# Patient Record
Sex: Female | Born: 1960 | Race: Black or African American | Hispanic: No | Marital: Married | Smoking: Never smoker
Health system: Southern US, Community
[De-identification: ages and names within clinical notes are randomized; demographics above are authoritative.]

## PROBLEM LIST (undated history)

## (undated) DIAGNOSIS — G4730 Sleep apnea, unspecified: Secondary | ICD-10-CM

## (undated) DIAGNOSIS — A6000 Herpesviral infection of urogenital system, unspecified: Secondary | ICD-10-CM

## (undated) DIAGNOSIS — D259 Leiomyoma of uterus, unspecified: Secondary | ICD-10-CM

## (undated) DIAGNOSIS — F419 Anxiety disorder, unspecified: Secondary | ICD-10-CM

## (undated) DIAGNOSIS — R195 Other fecal abnormalities: Secondary | ICD-10-CM

## (undated) HISTORY — PX: COLONOSCOPY: SHX174

## (undated) HISTORY — PX: BILATERAL OOPHORECTOMY: SHX1221

## (undated) HISTORY — PX: BREAST REDUCTION SURGERY: SHX8

## (undated) HISTORY — DX: Leiomyoma of uterus, unspecified: D25.9

## (undated) HISTORY — DX: Sleep apnea, unspecified: G47.30

## (undated) HISTORY — PX: APPENDECTOMY: SHX54

## (undated) HISTORY — DX: Anxiety disorder, unspecified: F41.9

## (undated) HISTORY — DX: Herpesviral infection of urogenital system, unspecified: A60.00

## (undated) HISTORY — DX: Other fecal abnormalities: R19.5

## (undated) HISTORY — PX: OTHER SURGICAL HISTORY: SHX169

## (undated) HISTORY — PX: ABDOMINAL HYSTERECTOMY: SHX81

---

## 1997-03-16 ENCOUNTER — Ambulatory Visit (HOSPITAL_COMMUNITY): Admission: RE | Admit: 1997-03-16 | Discharge: 1997-03-16 | Payer: Self-pay | Admitting: Obstetrics & Gynecology

## 1997-03-20 ENCOUNTER — Ambulatory Visit (HOSPITAL_COMMUNITY): Admission: RE | Admit: 1997-03-20 | Discharge: 1997-03-20 | Payer: Self-pay | Admitting: Obstetrics & Gynecology

## 1999-05-09 ENCOUNTER — Other Ambulatory Visit: Admission: RE | Admit: 1999-05-09 | Discharge: 1999-05-09 | Payer: Self-pay | Admitting: Obstetrics and Gynecology

## 1999-06-05 ENCOUNTER — Ambulatory Visit (HOSPITAL_COMMUNITY): Admission: RE | Admit: 1999-06-05 | Discharge: 1999-06-05 | Payer: Self-pay | Admitting: Obstetrics & Gynecology

## 1999-06-05 ENCOUNTER — Encounter: Payer: Self-pay | Admitting: Obstetrics & Gynecology

## 1999-06-19 ENCOUNTER — Ambulatory Visit (HOSPITAL_COMMUNITY): Admission: RE | Admit: 1999-06-19 | Discharge: 1999-06-19 | Payer: Self-pay | Admitting: Obstetrics & Gynecology

## 1999-06-19 ENCOUNTER — Encounter: Payer: Self-pay | Admitting: Obstetrics & Gynecology

## 1999-11-27 ENCOUNTER — Inpatient Hospital Stay (HOSPITAL_COMMUNITY): Admission: AD | Admit: 1999-11-27 | Discharge: 1999-11-27 | Payer: Self-pay | Admitting: Obstetrics & Gynecology

## 1999-11-28 ENCOUNTER — Inpatient Hospital Stay (HOSPITAL_COMMUNITY): Admission: AD | Admit: 1999-11-28 | Discharge: 1999-11-30 | Payer: Self-pay | Admitting: Obstetrics and Gynecology

## 2000-03-29 ENCOUNTER — Observation Stay (HOSPITAL_COMMUNITY): Admission: EM | Admit: 2000-03-29 | Discharge: 2000-03-30 | Payer: Self-pay | Admitting: Emergency Medicine

## 2000-03-29 ENCOUNTER — Encounter (INDEPENDENT_AMBULATORY_CARE_PROVIDER_SITE_OTHER): Payer: Self-pay | Admitting: *Deleted

## 2000-03-29 ENCOUNTER — Encounter: Payer: Self-pay | Admitting: Emergency Medicine

## 2002-06-24 ENCOUNTER — Encounter: Payer: Self-pay | Admitting: Emergency Medicine

## 2002-06-24 ENCOUNTER — Emergency Department (HOSPITAL_COMMUNITY): Admission: EM | Admit: 2002-06-24 | Discharge: 2002-06-24 | Payer: Self-pay | Admitting: Emergency Medicine

## 2002-08-09 ENCOUNTER — Other Ambulatory Visit: Admission: RE | Admit: 2002-08-09 | Discharge: 2002-08-09 | Payer: Self-pay | Admitting: Obstetrics and Gynecology

## 2003-06-20 ENCOUNTER — Ambulatory Visit (HOSPITAL_COMMUNITY): Admission: RE | Admit: 2003-06-20 | Discharge: 2003-06-20 | Payer: Self-pay | Admitting: Obstetrics and Gynecology

## 2003-08-10 ENCOUNTER — Other Ambulatory Visit: Admission: RE | Admit: 2003-08-10 | Discharge: 2003-08-10 | Payer: Self-pay | Admitting: Obstetrics and Gynecology

## 2004-02-21 ENCOUNTER — Emergency Department (HOSPITAL_COMMUNITY): Admission: EM | Admit: 2004-02-21 | Discharge: 2004-02-21 | Payer: Self-pay | Admitting: Emergency Medicine

## 2004-02-28 ENCOUNTER — Ambulatory Visit: Payer: Self-pay | Admitting: Internal Medicine

## 2004-03-06 ENCOUNTER — Ambulatory Visit: Payer: Self-pay | Admitting: Internal Medicine

## 2004-03-11 ENCOUNTER — Ambulatory Visit: Payer: Self-pay | Admitting: Internal Medicine

## 2004-03-31 ENCOUNTER — Encounter: Admission: RE | Admit: 2004-03-31 | Discharge: 2004-04-16 | Payer: Self-pay | Admitting: Internal Medicine

## 2004-05-14 ENCOUNTER — Ambulatory Visit: Payer: Self-pay | Admitting: Internal Medicine

## 2004-05-27 ENCOUNTER — Ambulatory Visit (HOSPITAL_BASED_OUTPATIENT_CLINIC_OR_DEPARTMENT_OTHER): Admission: RE | Admit: 2004-05-27 | Discharge: 2004-05-27 | Payer: Self-pay | Admitting: Internal Medicine

## 2004-06-06 ENCOUNTER — Ambulatory Visit: Payer: Self-pay | Admitting: Pulmonary Disease

## 2004-07-07 ENCOUNTER — Ambulatory Visit: Payer: Self-pay | Admitting: Internal Medicine

## 2004-07-16 ENCOUNTER — Ambulatory Visit: Payer: Self-pay | Admitting: Pulmonary Disease

## 2004-08-28 ENCOUNTER — Ambulatory Visit: Payer: Self-pay | Admitting: Internal Medicine

## 2004-10-09 ENCOUNTER — Ambulatory Visit: Payer: Self-pay | Admitting: Internal Medicine

## 2005-04-27 ENCOUNTER — Ambulatory Visit (HOSPITAL_COMMUNITY): Admission: RE | Admit: 2005-04-27 | Discharge: 2005-04-27 | Payer: Self-pay | Admitting: Obstetrics and Gynecology

## 2006-01-29 ENCOUNTER — Ambulatory Visit: Payer: Self-pay | Admitting: Internal Medicine

## 2006-09-06 ENCOUNTER — Encounter: Payer: Self-pay | Admitting: Internal Medicine

## 2007-03-11 ENCOUNTER — Ambulatory Visit: Payer: Self-pay | Admitting: Internal Medicine

## 2007-03-11 DIAGNOSIS — K624 Stenosis of anus and rectum: Secondary | ICD-10-CM | POA: Insufficient documentation

## 2007-03-11 DIAGNOSIS — K6289 Other specified diseases of anus and rectum: Secondary | ICD-10-CM | POA: Insufficient documentation

## 2007-03-11 DIAGNOSIS — F411 Generalized anxiety disorder: Secondary | ICD-10-CM | POA: Insufficient documentation

## 2007-03-11 DIAGNOSIS — K649 Unspecified hemorrhoids: Secondary | ICD-10-CM | POA: Insufficient documentation

## 2007-04-26 ENCOUNTER — Encounter: Payer: Self-pay | Admitting: Internal Medicine

## 2007-10-13 ENCOUNTER — Ambulatory Visit: Payer: Self-pay | Admitting: Internal Medicine

## 2007-10-13 DIAGNOSIS — H6690 Otitis media, unspecified, unspecified ear: Secondary | ICD-10-CM | POA: Insufficient documentation

## 2007-10-18 ENCOUNTER — Telehealth (INDEPENDENT_AMBULATORY_CARE_PROVIDER_SITE_OTHER): Payer: Self-pay | Admitting: *Deleted

## 2007-11-15 ENCOUNTER — Encounter: Payer: Self-pay | Admitting: Internal Medicine

## 2008-01-09 ENCOUNTER — Ambulatory Visit: Payer: Self-pay | Admitting: Genetic Counselor

## 2008-01-31 ENCOUNTER — Ambulatory Visit (HOSPITAL_COMMUNITY): Admission: RE | Admit: 2008-01-31 | Discharge: 2008-01-31 | Payer: Self-pay | Admitting: Obstetrics and Gynecology

## 2008-03-06 ENCOUNTER — Telehealth: Payer: Self-pay | Admitting: Internal Medicine

## 2008-03-06 ENCOUNTER — Ambulatory Visit: Payer: Self-pay | Admitting: Internal Medicine

## 2008-03-06 DIAGNOSIS — R079 Chest pain, unspecified: Secondary | ICD-10-CM | POA: Insufficient documentation

## 2008-03-07 ENCOUNTER — Ambulatory Visit: Payer: Self-pay | Admitting: Cardiology

## 2009-07-17 ENCOUNTER — Emergency Department (HOSPITAL_BASED_OUTPATIENT_CLINIC_OR_DEPARTMENT_OTHER): Admission: EM | Admit: 2009-07-17 | Discharge: 2009-07-18 | Payer: Self-pay | Admitting: Emergency Medicine

## 2009-07-25 ENCOUNTER — Ambulatory Visit: Admission: RE | Admit: 2009-07-25 | Discharge: 2009-07-25 | Payer: Self-pay | Admitting: Gynecologic Oncology

## 2009-08-26 ENCOUNTER — Ambulatory Visit (HOSPITAL_COMMUNITY): Admission: RE | Admit: 2009-08-26 | Discharge: 2009-08-26 | Payer: Self-pay | Admitting: Obstetrics and Gynecology

## 2009-08-28 ENCOUNTER — Telehealth: Payer: Self-pay | Admitting: Internal Medicine

## 2009-08-29 ENCOUNTER — Ambulatory Visit: Payer: Self-pay

## 2009-08-29 ENCOUNTER — Encounter: Payer: Self-pay | Admitting: Internal Medicine

## 2009-08-29 ENCOUNTER — Ambulatory Visit: Payer: Self-pay | Admitting: Internal Medicine

## 2009-08-29 ENCOUNTER — Encounter (HOSPITAL_COMMUNITY): Admission: RE | Admit: 2009-08-29 | Discharge: 2009-10-08 | Payer: Self-pay | Admitting: Internal Medicine

## 2009-08-29 DIAGNOSIS — G4733 Obstructive sleep apnea (adult) (pediatric): Secondary | ICD-10-CM | POA: Insufficient documentation

## 2009-08-29 DIAGNOSIS — N83209 Unspecified ovarian cyst, unspecified side: Secondary | ICD-10-CM | POA: Insufficient documentation

## 2009-08-29 DIAGNOSIS — R0602 Shortness of breath: Secondary | ICD-10-CM | POA: Insufficient documentation

## 2009-08-29 LAB — CONVERTED CEMR LAB: Blood Glucose, Fingerstick: 121

## 2009-09-02 ENCOUNTER — Inpatient Hospital Stay (HOSPITAL_COMMUNITY): Admission: RE | Admit: 2009-09-02 | Discharge: 2009-09-04 | Payer: Self-pay | Admitting: Obstetrics and Gynecology

## 2009-09-02 ENCOUNTER — Encounter (INDEPENDENT_AMBULATORY_CARE_PROVIDER_SITE_OTHER): Payer: Self-pay | Admitting: Obstetrics and Gynecology

## 2009-09-08 ENCOUNTER — Inpatient Hospital Stay (HOSPITAL_COMMUNITY): Admission: AD | Admit: 2009-09-08 | Discharge: 2009-09-08 | Payer: Self-pay | Admitting: Obstetrics & Gynecology

## 2009-09-08 ENCOUNTER — Ambulatory Visit: Payer: Self-pay | Admitting: Nurse Practitioner

## 2009-09-10 ENCOUNTER — Inpatient Hospital Stay (HOSPITAL_COMMUNITY): Admission: AD | Admit: 2009-09-10 | Discharge: 2009-09-15 | Payer: Self-pay | Admitting: Obstetrics and Gynecology

## 2009-09-11 ENCOUNTER — Encounter: Payer: Self-pay | Admitting: Obstetrics and Gynecology

## 2010-02-16 LAB — CONVERTED CEMR LAB
BUN: 10 mg/dL (ref 6–23)
CK-MB: 1.8 ng/mL (ref 0.3–4.0)
CO2: 27 meq/L (ref 19–32)
Chloride: 106 meq/L (ref 96–112)
Eosinophils Absolute: 0.2 10*3/uL (ref 0.0–0.7)
GFR calc non Af Amer: 72 mL/min
Glucose, Bld: 92 mg/dL (ref 70–99)
HCT: 35.4 % — ABNORMAL LOW (ref 36.0–46.0)
MCHC: 34.2 g/dL (ref 30.0–36.0)
MCV: 85.7 fL (ref 78.0–100.0)
Neutro Abs: 3.6 10*3/uL (ref 1.4–7.7)
Neutrophils Relative %: 60.3 % (ref 43.0–77.0)
RBC: 4.13 M/uL (ref 3.87–5.11)
RDW: 15.1 % — ABNORMAL HIGH (ref 11.5–14.6)
WBC: 6 10*3/uL (ref 4.5–10.5)

## 2010-02-18 ENCOUNTER — Other Ambulatory Visit: Payer: Self-pay | Admitting: Obstetrics and Gynecology

## 2010-02-18 DIAGNOSIS — N6321 Unspecified lump in the left breast, upper outer quadrant: Secondary | ICD-10-CM

## 2010-02-18 NOTE — Assessment & Plan Note (Signed)
Summary: Cardiology Nuclear Testing  Nuclear Med Background Indications for Stress Test: Evaluation for Ischemia, Surgical Clearance  Indications Comments: Pending Ovarian Surgery Monday 09/02/09, Dr. Maxie Better    Symptoms: Chest Pain, DOE, Fatigue with Exertion, SOB  Symptoms Comments: CP with Anxiety   Nuclear Pre-Procedure Caffeine/Decaff Intake: None NPO After: 9:00 AM Lungs: Clear IV 0.9% NS with Angio Cath: 24g     IV Site: Rt AC IV Started by: Bonnita Levan RN Chest Size (in) 40     Cup Size DDD     Height (in): 64 Weight (lb): 236 BMI: 40.66  Nuclear Med Study 1 or 2 day study:  1 day     Stress Test Type:  Stress Reading MD:  Arvilla Meres, MD     Referring MD:  Sonda Primes Resting Radionuclide:  Technetium 74m Tetrofosmin     Resting Radionuclide Dose:  11.0 mCi  Stress Radionuclide:  Technetium 9m Tetrofosmin     Stress Radionuclide Dose:  33.0 mCi   Stress Protocol Exercise Time (min):  6:00 min     Max HR:  162 bpm     Predicted Max HR:  174 bpm  Max Systolic BP: 123 mm Hg     Percent Max HR:  93.10 %     METS: 7.2 Rate Pressure Product:  16109    Stress Test Technologist:  Irean Hong RN     Nuclear Technologist:  Harlow Asa CNMT  Rest Procedure  Myocardial perfusion imaging was performed at rest 45 minutes following the intravenous administration of Myoview Technetium 68m Tetrofosmin.  Stress Procedure  The patient exercised for six minutes, RPE=14.  The patient stopped due to DOE  and denied any chest pain.  There were no significant ST-T wave changes.  Myoview was injected at peak exercise and myocardial perfusion imaging was performed after a brief delay.  QPS Raw Data Images:  There is interference from nuclear activity from structures below the diaphragm.  This does affect the ability to read the study. Stress Images:  Decreased uptake in the anterior wall Rest Images:  Decreased uptake in the anterior wall Subtraction (SDS):   There is a fixed anterior defect that is most consistent with breast attenuation. Transient Ischemic Dilatation:  0.80  (Normal <1.22)  Lung/Heart Ratio:  0.28  (Normal <0.45)  Quantitative Gated Spect Images QGS EDV:  72 ml QGS ESV:  19 ml QGS EF:  73 % QGS cine images:  Normal  Findings Normal nuclear study      Overall Impression  Exercise Capacity: Fair exercise capacity. BP Response: Normal blood pressure response. Clinical Symptoms: Dyspnea ECG Impression: No significant ST segment change suggestive of ischemia. Overall Impression: Normal stress nuclear study. Overall Impression Comments: There is a fixed anterior defect that is most consistent with breast attenuation.  Appended Document: Cardiology Nuclear Testing Misty Stanley, please inform - stress test was normal - good news    Thanks, AP   Appended Document: Cardiology Nuclear Testing pt informed

## 2010-02-18 NOTE — Progress Notes (Signed)
Summary: SURGERY - MONDAY  Phone Note Call from Patient Call back at Work Phone [phone]   Summary of Call: Patient is requesting clearance for surgery on Monday. She was advised that she needs a stress test as soon as possible. She was seen by ER in July for chest pain. Please advise.  Initial call taken by: Lamar Sprinkles, CMA,  August 28, 2009 3:15 PM  Follow-up for Phone Call        What surgery? We can work her in tomorrow (me or other MD). It is a very short notice - I do not think stress test can be done/read by Mon Follow-up by: Tresa Garter MD,  August 28, 2009 5:30 PM  Additional Follow-up for Phone Call Additional follow up Details #1::        Left vm to keep scheduled office visit tomorrow Additional Follow-up by: Lamar Sprinkles, CMA,  August 28, 2009 5:39 PM    Additional Follow-up for Phone Call Additional follow up Details #2::    Pt has appt today 9:15a Follow-up by: Margaret Pyle, CMA,  August 29, 2009 8:28 AM

## 2010-02-18 NOTE — Assessment & Plan Note (Signed)
Summary: DISCUSS SURGERY/#/CD   Vital Signs:  Patient profile:   50 year old female Weight:      240 pounds O2 Sat:      95 % on Room air Temp:     98.0 degrees F oral Pulse rate:   80 / minute Pulse rhythm:   regular Resp:     16 per minute BP sitting:   98 / 60  (right arm) Cuff size:   large  Vitals Entered By: Lanier Prude, CMA(AAMA) (August 29, 2009 9:31 AM)  O2 Flow:  Room air CBG Result 121   History of Present Illness: IM consult Req by Dr Cherly Hensen Reason Ovarian cyst susrgery surg clearance Hx: She went to ER in June for CP when she was stressed (best friend's mom died that day) - EKG and other w/up was neg. She has been CP-free x 1 month. There has been some DOE- chronic... She had labs at [facility] yesterday  Preventive Screening-Counseling & Management  Alcohol-Tobacco     Smoking Status: never  Caffeine-Diet-Exercise     Does Patient Exercise: yes  Current Medications (verified): 1)  Alprazolam 0.5 Mg  Tabs (Alprazolam) .Marland Kitchen.. 1 By Mouth Two Times A Day Prn 2)  Vitamin D3 1000 Unit  Tabs (Cholecalciferol) .Marland Kitchen.. 1 By Mouth Daily 3)  Hydrocodone-Acetaminophen 5-325 Mg Tabs (Hydrocodone-Acetaminophen) .Marland Kitchen.. 1 By Mouth Up To 4 Times Per Day As Needed For Pain  Allergies (verified): No Known Drug Allergies  Past History:  Past Surgical History: Last updated: 03/06/2008 IUD  Past Medical History: Anxiety GYN -Dr Billy Coast, Dr Cherly Hensen OSA on CPAP  Family History: Reviewed history from 03/06/2008 and no changes required. 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  The patient denies anorexia, fever, weight loss, vision loss, decreased hearing, hoarseness, chest pain, syncope, peripheral edema, prolonged  cough, headaches, hemoptysis, abdominal pain, melena, hematochezia, severe indigestion/heartburn, hematuria, incontinence, genital sores, muscle weakness, suspicious skin lesions, transient blindness, difficulty walking, depression, unusual weight change, abnormal bleeding, enlarged lymph nodes, angioedema, and breast masses.    Physical Exam  General:  alert and well-developed.   Head:  Normocephalic and atraumatic without obvious abnormalities. No apparent alopecia or balding. Eyes:  No corneal or conjunctival inflammation noted. EOMI. Perrla.  Ears:  External ear exam shows no significant lesions or deformities.  Otoscopic examination reveals clear canals, tympanic membranes are intact bilaterally without bulging, retraction, inflammation or discharge. Hearing is grossly normal bilaterally. Nose:  External nasal examination shows no deformity or inflammation. Nasal mucosa are pink and moist without lesions or exudates. Mouth:  Oral mucosa and oropharynx without lesions or exudates.  Teeth in good repair. Neck:  No deformities, masses, or tenderness noted. Chest Wall:  NT Lungs:  Normal respiratory effort, chest expands symmetrically. Lungs are clear to auscultation, no crackles or wheezes. Heart:  Normal rate and regular rhythm. S1 and S2 normal without gallop, murmur, click, rub or other extra sounds. Abdomen:  Bowel sounds positive,abdomen soft and non-tender without masses, organomegaly or hernias noted. Msk:  No deformity or scoliosis noted of thoracic or lumbar spine.   Pulses:  R and L carotid,radial,femoral,dorsalis pedis and posterior tibial pulses are full and equal bilaterally Extremities:  No clubbing, cyanosis, edema, or deformity  noted with normal full range of motion of all joints.   Neurologic:  No cranial nerve deficits noted. Station and gait are normal. Plantar reflexes are down-going bilaterally. DTRs are symmetrical throughout. Sensory, motor and coordinative functions appear  intact. Skin:  Intact without suspicious lesions or rashes Cervical Nodes:  No lymphadenopathy noted Inguinal Nodes:  No significant adenopathy Psych:  Cognition and judgment appear intact. Alert and cooperative with normal attention span and concentration. No apparent delusions, illusions, hallucinations   Impression & Recommendations:  Problem # 1:  PREOPERATIVE EXAMINATION (ICD-V72.84) Assessment New  She shoud be clear for surgery on Mon even if we are not able to accomplish the stress test:she is a  low risk for periop complications. I would suggest an aggressive DVT prophylaxis post-op due to her family h/o DVT/PE. She needs to cont to use CPAP at night.  EKG here is WNL  Orders: Cardiolite (Cardiolite)  Thank you!  Problem # 2:  CHEST PAIN, UNSPECIFIED (ICD-786.50) MSK - resolved 1 month ago Assessment: Comment Only   I have a very low to none suspicion for CAD presence.  Her ER doctor recomended stress test 1 month ago.  We can try to get  a CL stress test done tomorrow  however it would be hard to have it done and read in 1 day.   Orders: Cardiolite (Cardiolite)  Problem # 3:  DYSPNEA (ICD-786.05) due to deconditioning and overweight Assessment: Unchanged  Problem # 4:  OVARIAN CYST (ICD-620.2) Assessment: New Surgery is pending   Complete Medication List: 1)  Alprazolam 0.5 Mg Tabs (Alprazolam) .Marland Kitchen.. 1 by mouth two times a day prn 2)  Vitamin D3 1000 Unit Tabs (Cholecalciferol) .Marland Kitchen.. 1 by mouth daily 3)  Hydrocodone-acetaminophen 5-325 Mg Tabs (Hydrocodone-acetaminophen) .Marland Kitchen.. 1 by mouth up to 4 times per day as needed for pain  Other Orders: Capillary Blood Glucose/CBG (36644)  Patient Instructions: 1)  Please schedule a follow-up appointment in 4 months well w/labs.

## 2010-02-21 ENCOUNTER — Ambulatory Visit
Admission: RE | Admit: 2010-02-21 | Discharge: 2010-02-21 | Disposition: A | Discharge status: Home / Self Care | Payer: 59 | Source: Ambulatory Visit | Attending: Obstetrics and Gynecology | Admitting: Obstetrics and Gynecology

## 2010-02-21 ENCOUNTER — Other Ambulatory Visit: Payer: Self-pay | Admitting: Obstetrics and Gynecology

## 2010-02-21 DIAGNOSIS — N6321 Unspecified lump in the left breast, upper outer quadrant: Secondary | ICD-10-CM

## 2010-04-04 LAB — URINALYSIS, ROUTINE W REFLEX MICROSCOPIC
Bilirubin Urine: NEGATIVE
Glucose, UA: NEGATIVE mg/dL
Hgb urine dipstick: NEGATIVE
Hgb urine dipstick: NEGATIVE
Ketones, ur: NEGATIVE mg/dL
Ketones, ur: NEGATIVE mg/dL
Nitrite: NEGATIVE
Protein, ur: NEGATIVE mg/dL
Urobilinogen, UA: 0.2 mg/dL (ref 0.0–1.0)
pH: 6 (ref 5.0–8.0)

## 2010-04-04 LAB — DIFFERENTIAL
Basophils Relative: 0 % (ref 0–1)
Eosinophils Absolute: 0.2 10*3/uL (ref 0.0–0.7)
Lymphocytes Relative: 16 % (ref 12–46)
Lymphocytes Relative: 19 % (ref 12–46)
Neutro Abs: 7.1 10*3/uL (ref 1.7–7.7)
Neutro Abs: 8.8 10*3/uL — ABNORMAL HIGH (ref 1.7–7.7)
Neutrophils Relative %: 71 % (ref 43–77)
Neutrophils Relative %: 82 % — ABNORMAL HIGH (ref 43–77)

## 2010-04-04 LAB — CBC
HCT: 29.3 % — ABNORMAL LOW (ref 36.0–46.0)
HCT: 29.5 % — ABNORMAL LOW (ref 36.0–46.0)
HCT: 30.3 % — ABNORMAL LOW (ref 36.0–46.0)
HCT: 33.4 % — ABNORMAL LOW (ref 36.0–46.0)
Hemoglobin: 9.8 g/dL — ABNORMAL LOW (ref 12.0–15.0)
Hemoglobin: 10.9 g/dL — ABNORMAL LOW (ref 12.0–15.0)
MCH: 25.7 pg — ABNORMAL LOW (ref 26.0–34.0)
MCH: 26 pg (ref 26.0–34.0)
MCH: 26.6 pg (ref 26.0–34.0)
MCH: 26.2 pg (ref 26.0–34.0)
MCHC: 31.7 g/dL (ref 30.0–36.0)
MCHC: 32.5 g/dL (ref 30.0–36.0)
MCV: 79.1 fL (ref 78.0–100.0)
MCV: 80.2 fL (ref 78.0–100.0)
MCV: 80.2 fL (ref 78.0–100.0)
Platelets: 346 10*3/uL (ref 150–400)
Platelets: 300 10*3/uL (ref 150–400)
RBC: 3.78 MIL/uL — ABNORMAL LOW (ref 3.87–5.11)
RBC: 3.83 MIL/uL — ABNORMAL LOW (ref 3.87–5.11)
RDW: 18.1 % — ABNORMAL HIGH (ref 11.5–15.5)
WBC: 10 10*3/uL (ref 4.0–10.5)
WBC: 10.6 10*3/uL — ABNORMAL HIGH (ref 4.0–10.5)
WBC: 5.7 10*3/uL (ref 4.0–10.5)

## 2010-04-04 LAB — COMPREHENSIVE METABOLIC PANEL
ALT: 24 U/L (ref 0–35)
AST: 18 U/L (ref 0–37)
Alkaline Phosphatase: 84 U/L (ref 39–117)
CO2: 27 mEq/L (ref 19–32)
Chloride: 103 mEq/L (ref 96–112)
GFR calc non Af Amer: >60 mL/min (ref >60)
Glucose, Bld: 92 mg/dL (ref 70–99)
Potassium: 4.2 mEq/L (ref 3.5–5.1)
Sodium: 135 mEq/L (ref 135–145)
Total Protein: 7.7 g/dL (ref 6.0–8.3)

## 2010-04-04 LAB — BASIC METABOLIC PANEL
BUN: 4 mg/dL — ABNORMAL LOW (ref 6–23)
Calcium: 8.3 mg/dL — ABNORMAL LOW (ref 8.4–10.5)
Creatinine, Ser: 0.96 mg/dL (ref 0.4–1.2)
GFR calc Af Amer: >60 mL/min (ref >60)
Glucose, Bld: 149 mg/dL — ABNORMAL HIGH (ref 70–99)
Potassium: 4 mEq/L (ref 3.5–5.1)

## 2010-04-04 LAB — WET PREP, GENITAL: Trich, Wet Prep: NONE SEEN

## 2010-04-04 LAB — SURGICAL PCR SCREEN: Staphylococcus aureus: NEGATIVE

## 2010-04-04 LAB — BODY FLUID CULTURE

## 2010-04-06 LAB — BASIC METABOLIC PANEL
BUN: 13 mg/dL (ref 6–23)
CO2: 30 mEq/L (ref 19–32)
Calcium: 9.4 mg/dL (ref 8.4–10.5)
Chloride: 104 mEq/L (ref 96–112)
GFR calc non Af Amer: 60 mL/min — ABNORMAL LOW (ref >60)
Glucose, Bld: 98 mg/dL (ref 70–99)
Potassium: 3.8 mEq/L (ref 3.5–5.1)

## 2010-04-06 LAB — CBC
Hemoglobin: 11 g/dL — ABNORMAL LOW (ref 12.0–15.0)
Platelets: 347 10*3/uL (ref 150–400)
RBC: 4.21 MIL/uL (ref 3.87–5.11)

## 2010-04-06 LAB — POCT CARDIAC MARKERS
CKMB, poc: 1.7 ng/mL (ref 1.0–8.0)
Troponin i, poc: <0.05 ng/mL (ref 0.00–0.09)

## 2010-04-06 LAB — D-DIMER, QUANTITATIVE: D-Dimer, Quant: <0.22 ug/mL-FEU (ref 0.00–0.48)

## 2010-06-06 NOTE — Op Note (Signed)
[facility]  Patient:    Carol Conner, Carol Conner                      MRN: [id number] Proc. Date: 11/28/99 Adm. Date:  [id number] Attending:  Dierdre Forth [place]                           Operative Report  PREOPERATIVE DIAGNOSES:       Deep variables and late position.  OBSTETRICIAN:                 Cecilio Asper, M.D.  PROCEDURE:                    Low forceps vaginal delivery and first degree vaginal laceration repair.  ANESTHESIA:                   Epidural.  ESTIMATED BLOOD LOSS:         250 cc.  FINDINGS:                     Viable female infant named [place].  Apgars 2, 3, and 8.  Cord gas 7.13.  Nuchal cord x 1.  There was a thin placental cord with a little ______ jelly and also a first degree laceration.  COMPLICATIONS:                None.  HISTORY:                      Patient is a 50 year old gravida 5, para 0-0-4-0 who presented at 14 4/7 weeks estimated gestational age in labor.  Patient progressed to 10 cm dilatation.  During the course of the patients labor fetal heart tracing was noted to have variables.  Intrauterine pressure catheter was performed and amnio-infusion helped to resolve these variables during the first stage.  During the patients second stage the variables returned, became more persistent, and were down into the 60s-70s.  This continued despite discontinuing maternal pushing effort, increasing IV fluids, maternal position changes, and oxygen by face mask.  Pelvic examination revealed the vertex at a +2 station.  There was little molding.  The infant was felt to be direct OP.  The maternal pelvis at that point was felt to be borderline.  Contractions were adequate.  There was excellent maternal pushing effort.  The above was reviewed with the patient and her husband secondary to the severe deep variables that persisted despite the patient not pushing and a vertex in a position for operative vaginal delivery,  risks and benefits to forceps were reviewed.  Also discussed with the patient that her pelvis was felt to be borderline and that if the vertex did not descend in a timely fashion, would recommend cesarean delivery.  Therefore, prefer to deliver in the operating room with a double set-up.  Patient and her husband agreed and they were consented.  DESCRIPTION OF PROCEDURE:     Patient was brought to the operating room and placed in the stirrups.  Her vaginal area was prepped.  Red rubber catheter was used to drain the bladder of urine.  ______ Lodema Hong forceps were placed x 1 without difficulty.  The vaginal suture was felt to be directly in the midline between the blades.  With the next maternal pushing effort my left hand on the shank and my right hand on the handle, the  infant was brought to a +3 station.  With the second maternal pushing effort the infant was brought to crowning.  The forceps were then removed.  Patient then delivered the infant spontaneously.  The nuchal cord was reduced on the perineum.  Anterior and posterior shoulders were delivered as well as the rest of the body.  The infant was taken to the warmer and initially was felt to have poor respiratory effort, poor tone, some gag reflex, and a low heart rate.  The respiratory care team was then called from the intensive care unit.  Apgars were 2, 3, and 8.  Cord gas was 7.13.  Cord bloods were obtained.  Placenta delivered spontaneously.  Uterus firmed up nicely with fundal massage as well as IV Pitocin.  The vagina and cervix as well as the perineum were inspected for lacerations or tears.  There was a first degree midline vaginal laceration that was repaired with 3-0 Vicryl without difficulty.  Patient tolerated procedure well.  At the time of this dictation the infant is in the newborn nursery and mom is recovering well. DD:  11/28/99 TD:  11/28/99 Job: [id number]

## 2010-06-06 NOTE — Op Note (Signed)
[facility]  Patient:    INETTE, DOUBRAVA                     MRN: [id number] Proc. Date: 06/05/99 Attending:  Cecilio Asper, M.D.                           Operative Report  PREOPERATIVE DIAGNOSIS:       Advanced maternal age.  POSTOPERATIVE DIAGNOSIS:  OPERATION:                    Attempted genetic amniocentesis.  SURGEON:                      Cecilio Asper, M.D.  ASSISTANT:  ANESTHESIA:                   None.  ESTIMATED BLOOD LOSS:  INDICATIONS:                  The patient is a 50 year old, gravida 5, para 0, ho presents at approximately 16 weeks for genetic amniocentesis.  DESCRIPTION OF PROCEDURE:     After a fetal anatomic survey was performed in the ultrasound department, the patients abdomen was prepped and draped.  An amnio needle was inserted just below the umbilicus into the uterus.  The initial attempt did not reveal any amniotic fluid.  The needle was then redirected, but the patient was noted to have a fibroid as well as a large uterine contraction at the time f the procedure, and amniotic fluid could not be obtained.  At that point, the procedure was aborted and after discussion with the patient and her husband, it was decided to forego the amniocentesis.  The patient will return to the office in wo weeks for a complete anatomic survey and return to the office in one months time. DD:  06/11/99 TD:  06/14/99 Job: [id number]

## 2010-06-06 NOTE — H&P (Signed)
[facility]  Patient:    ASMA, BOLDON                      MRN: [id number] Adm. Date:  [id number] Attending:  Shaune Spittle Dictator:   Mack Guise, C.N.M.                         History and Physical  HISTORY OF PRESENT ILLNESS:   The patient is a 50 year old gravida 5, para 0-0-4-0, at 40-4/7 weeks.  EDD November 24, 1999, who presents after leaving MAU earlier this a.m. for labor check.  She has now progressed in her labor and will be admitted.  She reports positive fetal movement.  No bleeding and no rupture of membranes.  Denies any headache, visual changes, or epigastric pain.  Her pregnancy has been followed by the MD services at [facility], and is remarkable for advanced maternal age, history of HSV.  The patient denies any prodromes or any lesions at the present time, greater than two TABs, obesity, short cycles, family history of premenopausal breast cancer, group B strep negative.  This patient was initially evaluated at the office of [facility] on March 28, 1999, at approximately six weeks gestation.  Her pregnancy has been essentially uncomplicated.  She underwent genetic amniocentesis and has been followed through her pregnancy with ultrasound for size greater than dates which have been normal.  PRENATAL LABORATORY DATA:     On May 09, 1999, hemoglobin and hematocrit 11.8 and 34.8, platelets 302,000.  Blood type Rh A-positive, antibody screen negative.  Sickle cell trait negative.  VDRL nonreactive.  Rubella immune. Hepatitis B surface antigen negative.  HIV declined.  Pap smear within normal limits.  AFP/free beta hCG within normal range.  Underwent normal genetic amniocentesis, and at 36 weeks culture of vaginal tract is negative for group B strep.  OB HISTORY:                   In 1987, elective AB.  In 1988, elective AB.  In 1988, elective AB.  In 1990, elective AB in the _______ pregnancy.  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  FAMILY HISTORY:               Maternal cousin with heart disease.  Maternal grandfather with stroke.  Maternal uncle hypertension.  Maternal grandfather phlebitis.  The patients cousin scoliosis.  Mother breast cancer, age 37, died at age 49.  Maternal grandmother lung cancer.  Maternal uncle throat cancer.  PAST SURGICAL HISTORY:        Wisdom teeth in 1983.  GENETIC HISTORY:              Father of the baby has a first cousin with multiple sclerosis.  SOCIAL HISTORY:               The patient is a 50 year old married African-American female.  She works in [employer].  Her husband Jamayia Croker is involved and supportive.  He works in Clinical biochemist.  They are of the [place] faith.  ALLERGIES:                    No known drug allergies.  HABITS:                       The patient denies  the use of tobacco, alcohol, or illicit drugs.  REVIEW OF SYSTEMS:            There are no signs or symptoms suggestive of focal or systemic disease in a patient who is _________ with the uterine pregnancy at term in early labor.  PHYSICAL EXAMINATION:  VITAL SIGNS:                  Stable and afebrile.  HEART:                        Regular rate and rhythm.  LUNGS:                        Clear.  ABDOMEN:                      Gravid in its contour.  Uterine fundus is noted to extend 40 cm above the level of the pubic symphysis.  Leopold maneuver finds the infant to be in a longitudinal lie, cephalic presentation, and the estimated fetal weight is 7-1/2 to 8 pounds.  PELVIC EXAMINATION:           Digital exam of the cervix finds it to be 4 cm dilated, 90% effaced, cephalic presenting part at a -1 station, and a bulging bag of water per RN exam.  LABORATORY DATA:              The baseline of the fetal heart rate monitor is 130-140 with average long-term variability _________ is present with no periodic changes.  ASSESSMENT:                    Intrauterine pregnancy at term, early active                               labor.  PLAN:                         Admit to birthing suite per Dr. Dierdre Forth. Routine MD orders.  Dr. Synetta Fail Hudson-Fraley to follow. DD:  11/28/99 TD:  11/28/99 Job: [id number]

## 2010-06-06 NOTE — Procedures (Signed)
Carol Conner, Carol Conner               ACCOUNT NO.:  [id number]   MEDICAL RECORD NO.:  [id number]          PATIENT TYPE:  OUT   LOCATION:  SLEEP CENTER                 FACILITY:  [facility]   PHYSICIAN:  Marcelyn Bruins, M.D. [facility] DATE OF BIRTH:  [birth year]   DATE OF STUDY:  05/27/2004                              NOCTURNAL POLYSOMNOGRAM   REFERRING PHYSICIAN:  Georgina Quint. Plotnikov, M.D.   INDICATIONS FOR STUDY:  Hypersomnia with sleep apnea.   EPWORTH SCORE:  7.   SLEEP ARCHITECTURE:  The patient had a total sleep time of 413 minutes with  decreased REM and slow wave sleep. Sleep onset latency was very rapid, and  REM onset was prolonged at 244 minutes.   IMPRESSION:  1.  Split night study reveals severe obstructive sleep apnea with 103      obstructive events noted in the first 130 minutes of sleep. This gave      the patient a respiratory disturbance index of 48 events per hour and O2      desaturation as low as 85%. Events were not positional and were      associated with moderate snoring. By split night protocol, the patient      was then placed on a small comfort gel CPAP mask and ultimately titrated      to a pressure of 9 cm with excellent control of events even through      supine REM.  2.  No clinically significant cardiac arrhythmias.      KC/MEDQ  D:  06/04/2004 11:57:52  T:  06/04/2004 12:44:36  Job:  [id number]   cc:   Georgina Quint. Plotnikov, M.D. [facility]

## 2010-06-06 NOTE — Discharge Summary (Signed)
[facility]  Patient:    Carol Conner, Carol Conner                      MRN: [id number] Adm. Date:  [id number] Disc. Date: [id number] Attending:  Shaune Spittle Dictator:   Mack Guise, C.N.M.                           Discharge Summary  DISCHARGE DIAGNOSES:          1. Intrauterine pregnancy at term, delivered.                               2. Forceps-assisted vaginal delivery.  PROCEDURES:                   Low forceps vaginal delivery.  HOSPITAL COURSE:              Carol Conner is a 50 year old, gravida 5, para 0-0-4-0, admitted at term in active labor.  She progressed well in her labor, but developed deep variables which were late in their onset; therefore, she was assisted with a low forceps vaginal delivery by Dr. Cleatrice Burke. She had a first degree vaginal laceration which was repaired.  She delivered a viable female infant with Apgar scores of 2, 3, and 8.  Cord blood gas was 7.13. Hemoglobin on the first postpartum day was 9.6, and her second postpartum day she is judged to be in satisfactory condition for discharge.  DISCHARGE INSTRUCTIONS:       Per [facility] handout.  DISCHARGE MEDICATIONS:        1. Motrin 600 mg p.o. q.6h. p.r.n. pain.                               2. Tylox one to two p.o. q.3-4h. p.r.n. pain.                               3. Prenatal vitamins.                               4. The patient plans Depo-Provera for                                  contraception at six weeks.  DISCHARGE FOLLOWUP:           At [facility] in six weeks. DD:  11/30/99 TD:  11/30/99 Job: [id number]

## 2010-06-06 NOTE — Op Note (Signed)
[place]. [facility]  Patient:    Carol Conner, Carol Conner                      MRN: [id number] Adm. Date:  [id number] Attending:  Katha Cabal CC:         Sonda Primes, M.D. [facility]   Operative Report  PREOPERATIVE DIAGNOSIS:  Acute appendicitis.  POSTOPERATIVE DIAGNOSIS:  Acute appendicitis.  PROCEDURE:  Laparoscopic appendectomy.  SURGEON:  Thornton Park. Daphine Deutscher, M.D.  ANESTHESIA:  General.  DESCRIPTION OF PROCEDURE:  [name] is a 50 year old lady who is four months postpartum and who has had a two-day history of abdominal pain localized in the right lower quadrant.  She had a CT scan suggestive of appendicitis.  Informed consent was obtained from her and her husband regarding laparoscopic as well as open appendectomy.  She was taken to room 1 and given general anesthesia.  Preoperatively she received 3 g of Unasyn. After prepping with Betadine and draping sterilely, I made a longitudinal incision down into the umbilicus, through which the Hasson cannula was passed. The abdomen was insufflated.  A 5 mm was placed in the right upper quadrant, and that enabled me to mobilize a very swollen, turgid, long appendix.  It seemed to be stuck at the base.  I then put a 10/11 in the left lower quadrant and put the scope in that port and operated out of the upper two ports.  The base was isolated, and I went through the mesentery of the appendix with an Endo-GIA with a vascular stapler.  I had to fire that twice and isolated the stump.  I had to clean this off a little bit and then went through it with two firings of the blue cartridges.  A final staple disconnected it from the surrounding tissue laterally.  The appendix was placed in a bag and then brought out through the umbilicus.  I irrigated the appendiceal stump.  It did not appear to be bleeding, and I withdrew the previously-used irrigant.  I did look at her gallbladder, and it had a nice robins egg  blue appearance.  It did not appear to have any evidence of cholecystitis.  The umbilical port was tied down and repaired under direct vision.  The 5 mm was withdrawn.  The abdomen was deflated, and the left lower quadrant trocar was withdrawn as well. Vicryl 4-0 was used in the subcutaneous tissue with benzoin and Steri-Strips.  The patient tolerated this procedure well and was taken to the recovery room in satisfactory condition. DD:  03/29/00 TD:  03/30/00 Job: [id number]

## 2011-05-29 IMAGING — CT CT ABCESS DRAINAGE
1 of 10 series · 7 of 32 positions shown, 12 images · non-contrast
Comparison: none

CLINICAL HISTORY: 48-year-old with pelvic abscess and recent
hysterectomy.

[Series 2: rtn ap without · axial · non-contrast · 0.93mm/px · z∈[-282,-137]mm · 7 of 39 slices shown, 12 images]
[im 5/39  soft-tissue]
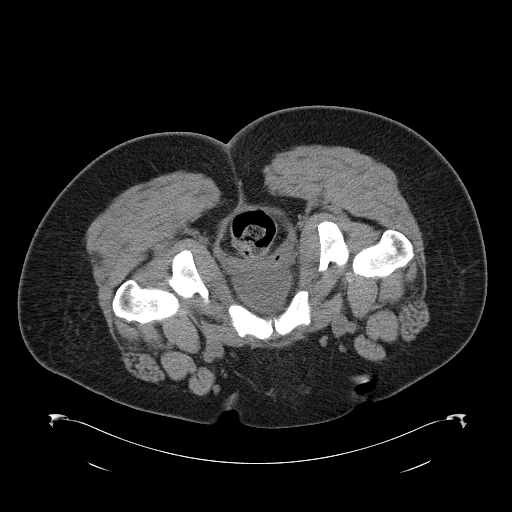
[im 5/39  bone]
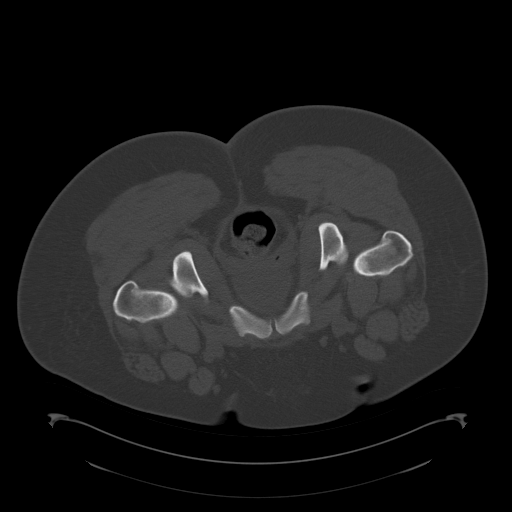
[im 10/39  soft-tissue]
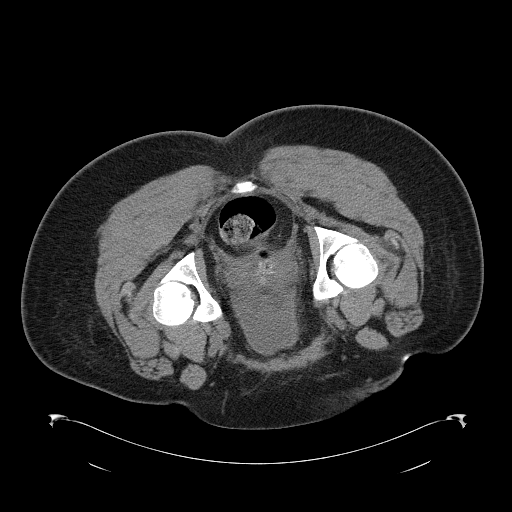
[im 15/39  soft-tissue]
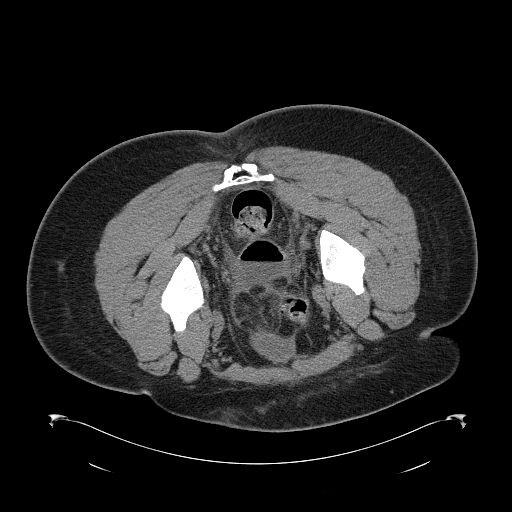
[im 20/39  soft-tissue]
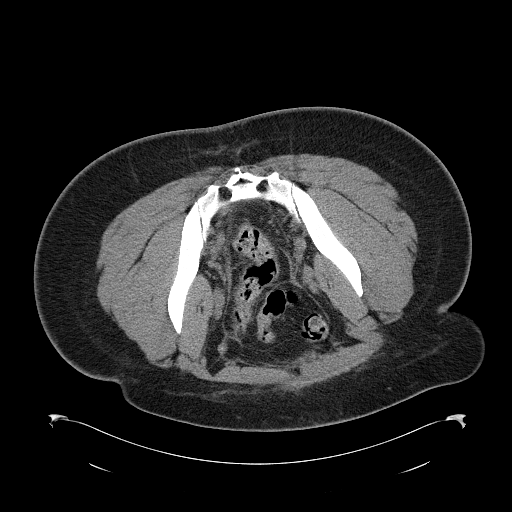
[im 20/39  lung]
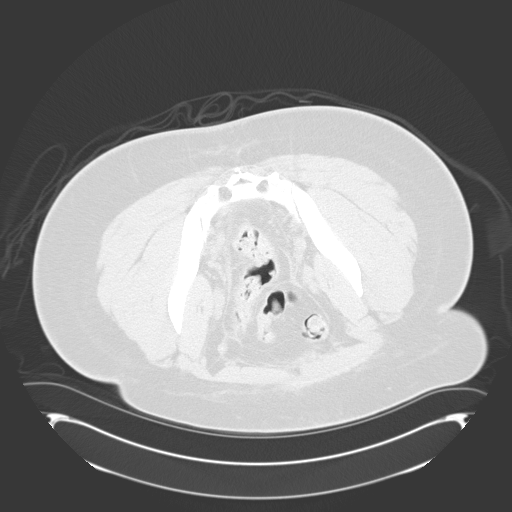
[im 24/39  soft-tissue]
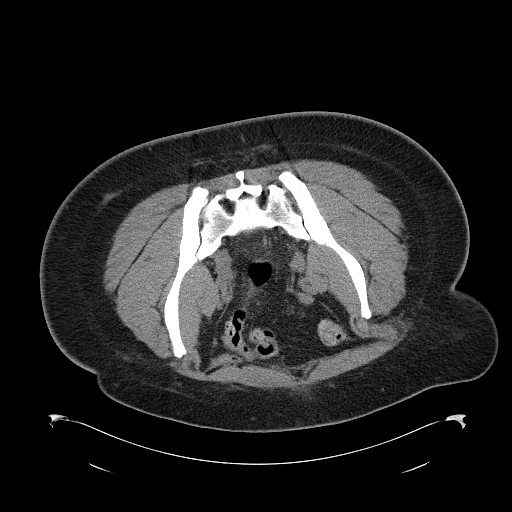
[im 24/39  lung]
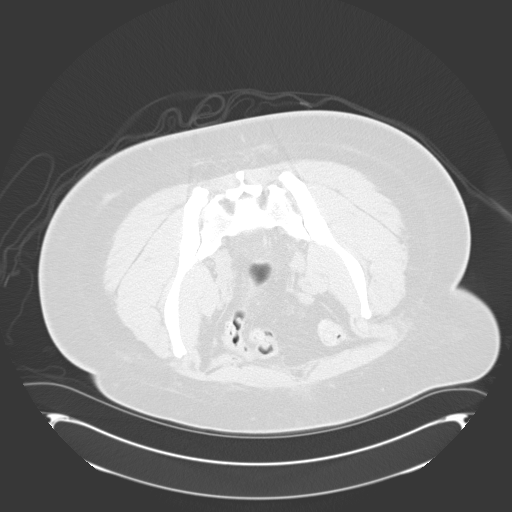
[im 29/39  soft-tissue]
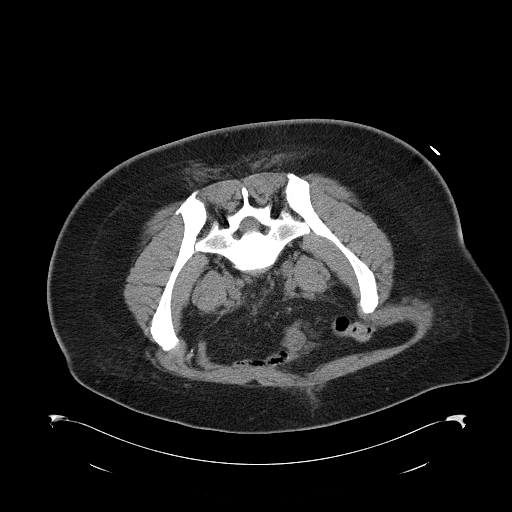
[im 29/39  lung]
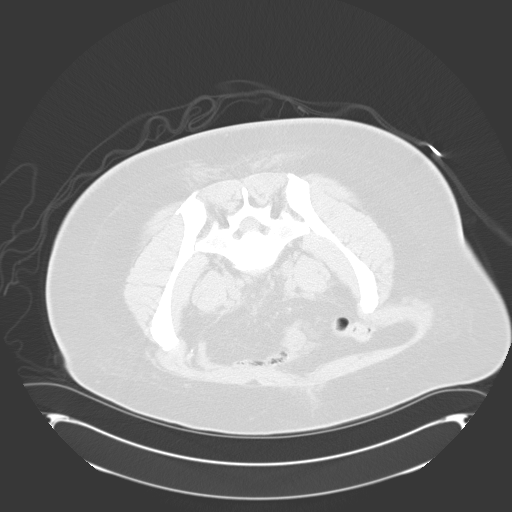
[im 34/39  soft-tissue]
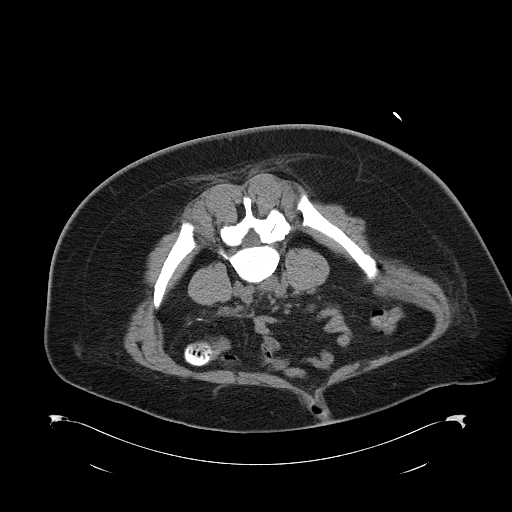
[im 34/39  lung]
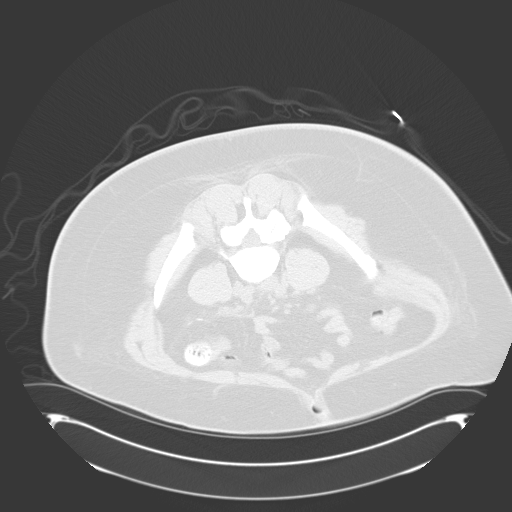

[7 of 32 positions shown; findings below may reference images not displayed]

PROCEDURE(S): CT GUIDED PELVIC DRAIN PLACEMENT

Medications:Versed 4 mg, Fentanyl 125 mcg. A radiology nurse
monitored the patient for moderate sedation.

Moderate sedation time:20 minutes

Procedure:The procedure was explained to the patient.  The risks
and benefits of the procedure were discussed and the patient's
questions were addressed.  Informed consent was obtained from the
patient. The patient was placed prone on the CT scanner.  Images of
the pelvis were obtained.  The left gluteal region was prepped and
draped in a sterile fashion.  Skin was anesthetized with 1%
lidocaine.  18 gauge needle was directed into the air-fluid
collection anterior to the rectum from a transgluteal approach.
Brown foul-smelling fluid was aspirated.  Stiff Amplatz wire was
placed in the collection and the tract was dilated to 10-French.  A
10 French multipurpose drain was placed over the wire and
reconstituted in the collection.  20 ml of brown fluid was
aspirated.  Catheter was secured to the skin and attached to a JP
bulb.
FINDINGS: 4.3 cm abscess collection in the pelvis.
IMPRESSION: CT guided placement of a drainage catheter in the pelvic
abscess.

## 2012-01-20 LAB — HM MAMMOGRAPHY

## 2012-01-28 ENCOUNTER — Emergency Department (HOSPITAL_COMMUNITY)
Admission: EM | Admit: 2012-01-28 | Discharge: 2012-01-28 | Disposition: A | Discharge status: Home / Self Care | Payer: 59 | Attending: Emergency Medicine | Admitting: Emergency Medicine

## 2012-01-28 ENCOUNTER — Encounter (HOSPITAL_COMMUNITY): Payer: Self-pay

## 2012-01-28 DIAGNOSIS — J029 Acute pharyngitis, unspecified: Secondary | ICD-10-CM | POA: Insufficient documentation

## 2012-01-28 MED ORDER — IBUPROFEN 600 MG PO TABS: 600.0000 mg | ORAL_TABLET | Freq: Four times a day (QID) | ORAL | Status: DC | PRN

## 2012-01-28 MED ORDER — LIDOCAINE VISCOUS 2 % MT SOLN: 20.0000 mL | OROMUCOSAL | Status: DC | PRN

## 2012-01-28 MED ORDER — LIDOCAINE VISCOUS 2 % MT SOLN
20.0000 mL | Freq: Once | OROMUCOSAL | Status: AC
  Administered 2012-01-28: 20 mL via OROMUCOSAL
  Filled 2012-01-28: qty 20

## 2012-01-28 NOTE — ED Provider Notes (Signed)
History     CSN: [id number]  Arrival date & time 01/28/12  0303   First MD Initiated Contact with Patient 01/28/12 0320      Chief Complaint  Patient presents with  . Cough  . Sore Throat    (Consider location/radiation/quality/duration/timing/severity/associated sxs/prior treatment) HPI Comments: Pt comes in with cc of sore throat, and mild dib. Pt states that she has some sore throat for the past few days, and started having cough later on. This night, she felt like her throat was "closing" so she came to the ER. She feels better at my evaluation. Denies any true DIB, diffculty controlling her secretions, worsening sob when supine. She has no n/v/f/c. Pt has some pain with swallowing. Strep test at triage is normal.  Patient is a 52 y.o. female presenting with cough and pharyngitis. The history is provided by the patient.  Cough Associated symptoms include sore throat. Pertinent negatives include no chest pain, no headaches, no shortness of breath and no wheezing.  Sore Throat Pertinent negatives include no chest pain, no abdominal pain, no headaches and no shortness of breath.    History reviewed. No pertinent past medical history.  Past Surgical History  Procedure Date  . Abdominal hysterectomy     History reviewed. No pertinent family history.  History  Substance Use Topics  . Smoking status: Never Smoker   . Smokeless tobacco: Not on file  . Alcohol Use: No    OB History    Grav Para Term Preterm Abortions TAB SAB Ect Mult Living                  Review of Systems  Constitutional: Negative for activity change.  HENT: Positive for sore throat. Negative for facial swelling, drooling, trouble swallowing and neck pain.   Respiratory: Positive for cough. Negative for shortness of breath and wheezing.   Cardiovascular: Negative for chest pain.  Gastrointestinal: Negative for nausea, vomiting, abdominal pain, diarrhea, constipation, blood in stool and abdominal  distention.  Genitourinary: Negative for dysuria, hematuria and difficulty urinating.  Skin: Negative for color change.  Neurological: Negative for speech difficulty and headaches.  Hematological: Does not bruise/bleed easily.  Psychiatric/Behavioral: Negative for confusion.    Allergies  Review of patient's allergies indicates no known allergies.  Home Medications   Current Outpatient Rx  Name  Route  Sig  Dispense  Refill  . IBUPROFEN 200 MG PO TABS   Oral   Take 600 mg by mouth every 6 (six) hours as needed. Throat pain           BP 121/72  Pulse 72  Temp 98.1 F (36.7 C) (Oral)  Resp 20  Ht 5\' 4"  (1.626 m)  Wt 243 lb (110.224 kg)  BMI 41.71 kg/m2  SpO2 100%  Physical Exam  Nursing note and vitals reviewed. Constitutional: She is oriented to person, place, and time. She appears well-developed and well-nourished.  HENT:  Head: Normocephalic and atraumatic.  Mouth/Throat: No oropharyngeal exudate.       Mild tonsillar enlargement, and pharyngeal erythema. No stridors   Eyes: EOM are normal. Pupils are equal, round, and reactive to light.  Neck: Neck supple.  Cardiovascular: Normal rate, regular rhythm and normal heart sounds.   No murmur heard. Pulmonary/Chest: Effort normal. No respiratory distress.  Abdominal: Soft. She exhibits no distension. There is no tenderness. There is no rebound and no guarding.  Neurological: She is alert and oriented to person, place, and time.  Skin: Skin is  warm and dry.    ED Course  Procedures (including critical care time)   Labs Reviewed  RAPID STREP SCREEN   No results found.   No diagnosis found.    MDM  Pt comes in with cc of sore throat.  Her exam is benign. She has no resp distress, no drooling, no true concerns for airway emergency - and she herself is feeling a little better. Will discharge. We discussed decadron IM to possibly help with her dysphagia - but she feels better already, and doesn't want it.    Derwood Kaplan, MD 01/28/12 [id number]

## 2012-01-28 NOTE — ED Notes (Signed)
Per pt, sore throat and cough since Saturday.  Ibuprofen at home not easing discomfort.  Swallowing glass feeling but no fever.

## 2012-01-29 ENCOUNTER — Telehealth: Payer: Self-pay | Admitting: Internal Medicine

## 2012-01-29 NOTE — Telephone Encounter (Signed)
Agree. Thx 

## 2012-01-29 NOTE — Telephone Encounter (Signed)
[facility] Triage Call Report Triage Record Num: [id number] Operator: Candida Peeling Patient Name: Carol Conner Call Date & Time: 01/27/2012 11:46:43PM Patient Phone: [phone] PCP: Sonda Primes Patient Gender: Female PCP Fax : [phone] Patient DOB: [birth year] Practice Name: [facility] Reason for Call: Caller: Kyanna/Patient; PCP: Sonda Primes (Adults only); CB#: [phone]; Call regarding Throat Pain and Possibly Closing Or Inflamed; Onset 01/23/12 sore throat began. On 01/27/12 sore throat has worsened, throat feels swollen. Afebrile. Has taken 1200 mgm Ibuprofen took 800 mgm and then one hour later 400 mgm more and then coughing began. Voice sounds muffled but is able to swallow saliva. Ate something warm. LMP hysterectomy 4 years. Emergent sxs of "Marked difficulty swallowing due to sore throat unresponsive to 12 hours of home care" per Sore Throat or Hoarseness protocol. RN advised be seen in ED now for evaluation. Caller will go to [facility]. Protocol(s) Used: Sore Throat or Hoarseness Recommended Outcome per Protocol: See Provider within 4 hours Override Outcome if Used in Protocol: See ED Immediately RN Reason for Override Outcome: Nursing Judgement Used. Reason for Outcome: Marked difficulty swallowing due to sore throat unresponsive to 12 hours of home care Care Advice: ~ Use a cool mist humidifier to moisten air. Be sure to clean according to manufacturer's instructions. ~ May inhale steam from hot shower or heated water. Be careful to avoid burns. Call EMS 911 if sudden onset or sudden worsening of breathing problems, struggling to breathe, high pitched noise when breathing in (stridor), unable to speak, grasping at throat, or panic/anxiety because of breathing problems. ~ Analgesic/Antipyretic Advice - Acetaminophen: Consider acetaminophen as directed on label or by pharmacist/provider for pain or fever PRECAUTIONS: - Use if there is no history of liver  disease, alcoholism, or intake of three or more alcohol drinks per day - Only if approved by provider during pregnancy or when breastfeeding - During pregnancy, acetaminophen should not be taken more than 3 consecutive days without telling provider - Do not exceed recommended dose or frequency ~ ~ Call 911 if voice muffled, is unable to swallow own saliva and is drooling or choking sensation. Sore Throat Relief: - Use warm salt water gargles 3 to 4 times/day, as needed (1/2 tsp. salt in 8 oz. [.2 liters] water). - Suck on hard candy, nonprescription or herbal throat lozenges (sugar-free if diabetic) - Eat soothing, soft food/fluids (broths, soups, or honey and lemon juice in hot tea, Popsicles, frozen yogurt or sherbet, scrambled eggs, cooked cereals, Jell-O or puddings) whichever is most comforting. - Avoid eating salty, spicy or acidic foods. ~ ~ Rest until symptoms improve. If more than [redacted] weeks pregnant, lie on left side when resting. Analgesic/Antipyretic Advice - NSAIDs: Consider aspirin, ibuprofen, naproxen or ketoprofen for pain or fever as directed on label or by pharmacist/provider. PRECAUTIONS: - If over 17 years of age, should not take longer than 1 week without consulting provider. EXCEPTIONS: - Should not be used if taking blood thinners or have bleeding problems

## 2012-09-29 ENCOUNTER — Ambulatory Visit (INDEPENDENT_AMBULATORY_CARE_PROVIDER_SITE_OTHER): Payer: 59 | Admitting: Pulmonary Disease

## 2012-09-29 ENCOUNTER — Encounter: Payer: Self-pay | Admitting: Pulmonary Disease

## 2012-09-29 VITALS — BP 102/70 | HR 78 | Temp 97.8°F | Ht 63.75 in | Wt 248.0 lb

## 2012-09-29 DIAGNOSIS — G4733 Obstructive sleep apnea (adult) (pediatric): Secondary | ICD-10-CM

## 2012-09-29 NOTE — Progress Notes (Signed)
Chief Complaint  Patient presents with  . Sleep Consult    Self Referred. Epworth Score: 8.    History of Present Illness: Carol Conner is a 52 y.o. female for evaluation of sleep problems.  She made this appointment on her own.  She was seen several years ago for sleep apnea.  She had sleep study that showed apnea.  Unfortunately she lost her insurance then, and had to pay for CPAP set up out of pocket.  She has recent regained insurance coverage, and would like to have her supplies set up through insurance now.  She goes to sleep at between 11 pm and 1 am.  She falls asleep quickly after taking magnesium pill.  She sleeps through the night.  She gets out of bed at 7 am.  She feels rested in the morning.  She denies morning headache.  She does not use anything to help her fall sleep or stay awake.  She denies sleep walking, sleep talking, bruxism, or nightmares.  There is no history of restless legs.  She denies sleep hallucinations, sleep paralysis, or cataplexy.  The Epworth score is 8 out of 24.   Carol Conner  has a past medical history of Sleep apnea.  Carol Conner  has past surgical history that includes Abdominal hysterectomy; Abdominal hysterectomy; and Appendectomy.  Prior to Admission medications   Medication Sig Start Date End Date Taking? Authorizing Provider  ibuprofen (ADVIL,MOTRIN) 200 MG tablet Take 600 mg by mouth every 6 (six) hours as needed. Throat pain   Yes Historical Provider, MD  magnesium 30 MG tablet Take 250 mg by mouth at bedtime.   Yes Historical Provider, MD    No Known Allergies  Her family history includes Breast cancer in her mother; Cancer in her father; Kidney disease in her mother; Pulmonary embolism in her sister.  She  reports that she has never smoked. She does not have any smokeless tobacco history on file. She reports that she does not drink alcohol or use illicit drugs.  Review of Systems  Constitutional: Negative for fever,  chills, diaphoresis, activity change, appetite change, fatigue and unexpected weight change.  HENT: Negative for hearing loss, ear pain, nosebleeds, congestion, sore throat, facial swelling, rhinorrhea, sneezing, mouth sores, trouble swallowing, neck pain, neck stiffness, dental problem, voice change, postnasal drip, sinus pressure, tinnitus and ear discharge.   Eyes: Negative for photophobia, discharge, itching and visual disturbance.  Respiratory: Negative for apnea, cough, choking, chest tightness, shortness of breath, wheezing and stridor.   Cardiovascular: Negative for chest pain, palpitations and leg swelling.  Gastrointestinal: Negative for nausea, vomiting, abdominal pain, constipation, blood in stool and abdominal distention.  Genitourinary: Negative for dysuria, urgency, frequency, hematuria, flank pain, decreased urine volume and difficulty urinating.  Musculoskeletal: Negative for myalgias, back pain, joint swelling, arthralgias and gait problem.  Skin: Negative for color change, pallor and rash.  Neurological: Negative for dizziness, tremors, seizures, syncope, speech difficulty, weakness, light-headedness, numbness and headaches.  Hematological: Negative for adenopathy. Does not bruise/bleed easily.  Psychiatric/Behavioral: Negative for confusion, sleep disturbance and agitation. The patient is not nervous/anxious.    Physical Exam:  General - No distress ENT - No sinus tenderness, no oral exudate, no LAN, MP 3, enlarged tongue, no thyromegaly, TM clear, pupils equal/reactive Cardiac - s1s2 regular, no murmur, pulses symmetric Chest - No wheeze/rales/dullness, good air entry, normal respiratory excursion Back - No focal tenderness Abd - Soft, non-tender, no organomegaly, + bowel sounds Ext - No  edema Neuro - Normal strength, cranial nerves intact Skin - No rashes Psych - Normal mood, and behavior  Assessment/Plan:  Coralyn Helling, MD [facility] Pulmonary/Critical Care 09/29/2012,  3:28 PM Pager:  [phone] After 3pm call: [phone]

## 2012-09-29 NOTE — Patient Instructions (Signed)
Will arrange for home sleep study   Follow up in 3 months 

## 2012-09-29 NOTE — Progress Notes (Deleted)
  Subjective:    Patient ID: Carol Conner, female    DOB: 09-05-1960, 52 y.o.   MRN: [id number]  HPI    Review of Systems  Constitutional: Negative for fever, chills, diaphoresis, activity change, appetite change, fatigue and unexpected weight change.  HENT: Negative for hearing loss, ear pain, nosebleeds, congestion, sore throat, facial swelling, rhinorrhea, sneezing, mouth sores, trouble swallowing, neck pain, neck stiffness, dental problem, voice change, postnasal drip, sinus pressure, tinnitus and ear discharge.   Eyes: Negative for photophobia, discharge, itching and visual disturbance.  Respiratory: Negative for apnea, cough, choking, chest tightness, shortness of breath, wheezing and stridor.   Cardiovascular: Negative for chest pain, palpitations and leg swelling.  Gastrointestinal: Negative for nausea, vomiting, abdominal pain, constipation, blood in stool and abdominal distention.  Genitourinary: Negative for dysuria, urgency, frequency, hematuria, flank pain, decreased urine volume and difficulty urinating.  Musculoskeletal: Negative for myalgias, back pain, joint swelling, arthralgias and gait problem.  Skin: Negative for color change, pallor and rash.  Neurological: Negative for dizziness, tremors, seizures, syncope, speech difficulty, weakness, light-headedness, numbness and headaches.  Hematological: Negative for adenopathy. Does not bruise/bleed easily.  Psychiatric/Behavioral: Negative for confusion, sleep disturbance and agitation. The patient is not nervous/anxious.        Objective:   Physical Exam        Assessment & Plan:

## 2012-09-29 NOTE — Assessment & Plan Note (Signed)
She has history of sleep apnea.  She has been paying for her own equipment.  She not has insurance coverage for her sleep apnea.  She will need more recent sleep study to further assess status of sleep apnea, and then will plan to arrange for CPAP supply upgrade through DME company.  We discussed how sleep apnea can affect various health problems including risks for hypertension, cardiovascular disease, and diabetes.  We also discussed how sleep disruption can increase risks for accident, such as while driving.  Weight loss as a means of improving sleep apnea was also reviewed.  Additional treatment options discussed were CPAP therapy, oral appliance, and surgical intervention.

## 2012-10-03 DIAGNOSIS — G4730 Sleep apnea, unspecified: Secondary | ICD-10-CM

## 2012-10-10 ENCOUNTER — Other Ambulatory Visit (HOSPITAL_COMMUNITY): Payer: Self-pay | Admitting: Obstetrics and Gynecology

## 2012-10-10 DIAGNOSIS — Z1231 Encounter for screening mammogram for malignant neoplasm of breast: Secondary | ICD-10-CM

## 2012-10-11 ENCOUNTER — Telehealth: Payer: Self-pay | Admitting: Pulmonary Disease

## 2012-10-11 DIAGNOSIS — G4733 Obstructive sleep apnea (adult) (pediatric): Secondary | ICD-10-CM

## 2012-10-11 NOTE — Telephone Encounter (Signed)
Called, spoke with pt.  She has a home sleep test done last Monday night and dropped in off on Tuesday.  She is requesting results.  Dr. Craige Cotta, pls advise.  Thank you.

## 2012-10-12 ENCOUNTER — Other Ambulatory Visit: Payer: Self-pay | Admitting: Pulmonary Disease

## 2012-10-12 DIAGNOSIS — G4733 Obstructive sleep apnea (adult) (pediatric): Secondary | ICD-10-CM

## 2012-10-12 DIAGNOSIS — G4730 Sleep apnea, unspecified: Secondary | ICD-10-CM

## 2012-10-12 NOTE — Telephone Encounter (Signed)
I spoke with patient about results and she verbalized understanding and had no questions 

## 2012-10-12 NOTE — Telephone Encounter (Signed)
HST 10/03/12 > AHI 13.7, SaO2 low 85%.  Will have my nurse inform pt that sleep study shows mild sleep apnea.    I have sent order to get her established with new DME for CPAP supplies.

## 2012-10-13 ENCOUNTER — Encounter: Payer: Self-pay | Admitting: Pulmonary Disease

## 2012-10-13 ENCOUNTER — Telehealth: Payer: Self-pay | Admitting: Pulmonary Disease

## 2012-10-13 DIAGNOSIS — G4733 Obstructive sleep apnea (adult) (pediatric): Secondary | ICD-10-CM

## 2012-10-13 NOTE — Telephone Encounter (Signed)
Can change order to auto CPAP 5 to 20 cm H2O.

## 2012-10-13 NOTE — Telephone Encounter (Signed)
Dr. Craige Cotta please advise if pt auto setting is 5-20 CM? thanks

## 2012-10-13 NOTE — Telephone Encounter (Signed)
Order placed. Jennifer Castillo, CMA  

## 2012-10-14 ENCOUNTER — Ambulatory Visit (HOSPITAL_COMMUNITY)
Admission: RE | Admit: 2012-10-14 | Discharge: 2012-10-14 | Disposition: A | Discharge status: Home / Self Care | Payer: 59 | Source: Ambulatory Visit | Attending: Obstetrics and Gynecology | Admitting: Obstetrics and Gynecology

## 2012-10-14 DIAGNOSIS — Z1231 Encounter for screening mammogram for malignant neoplasm of breast: Secondary | ICD-10-CM | POA: Insufficient documentation

## 2012-10-17 ENCOUNTER — Telehealth: Payer: Self-pay | Admitting: Pulmonary Disease

## 2012-10-17 NOTE — Telephone Encounter (Signed)
I spoke with Melissa. She stated the s10 are on back order. It will take about 3 weeks to get this in. She wants to know if pt is okay to wait or set her up with s9. Please advise Dr. Craige Cotta thanks

## 2012-10-18 NOTE — Telephone Encounter (Signed)
Okay to arrange for S9.

## 2012-10-19 NOTE — Telephone Encounter (Signed)
I spoke with Melissa and made here aware of VS recs. Nothing further needed

## 2012-11-14 ENCOUNTER — Telehealth: Payer: Self-pay | Admitting: Pulmonary Disease

## 2012-11-14 NOTE — Telephone Encounter (Signed)
I will forward this message to Dr. Craige Cotta so he will be aware of this information.

## 2012-11-14 NOTE — Telephone Encounter (Signed)
Noted  

## 2012-11-24 ENCOUNTER — Other Ambulatory Visit: Payer: Self-pay

## 2013-03-13 ENCOUNTER — Telehealth: Payer: Self-pay | Admitting: Internal Medicine

## 2013-03-13 NOTE — Telephone Encounter (Signed)
Pt has an appt schedule.  

## 2013-03-13 NOTE — Telephone Encounter (Signed)
Pt request reestablish, last ov was 2011. Pt request to be work in due to bleeding in stool. Please advise. Pt has [employer] for insurance and the phone [phone].

## 2013-03-13 NOTE — Telephone Encounter (Signed)
Ok Thx 

## 2013-03-24 ENCOUNTER — Ambulatory Visit (INDEPENDENT_AMBULATORY_CARE_PROVIDER_SITE_OTHER): Payer: 59 | Admitting: Internal Medicine

## 2013-03-24 ENCOUNTER — Encounter: Payer: Self-pay | Admitting: Internal Medicine

## 2013-03-24 ENCOUNTER — Encounter: Payer: Self-pay | Admitting: Gastroenterology

## 2013-03-24 VITALS — BP 110/80 | HR 72 | Temp 98.1°F | Resp 16 | Ht 63.0 in | Wt 251.0 lb

## 2013-03-24 DIAGNOSIS — R7303 Prediabetes: Secondary | ICD-10-CM | POA: Insufficient documentation

## 2013-03-24 DIAGNOSIS — R7309 Other abnormal glucose: Secondary | ICD-10-CM

## 2013-03-24 DIAGNOSIS — E119 Type 2 diabetes mellitus without complications: Secondary | ICD-10-CM | POA: Insufficient documentation

## 2013-03-24 DIAGNOSIS — D62 Acute posthemorrhagic anemia: Secondary | ICD-10-CM | POA: Insufficient documentation

## 2013-03-24 DIAGNOSIS — R739 Hyperglycemia, unspecified: Secondary | ICD-10-CM | POA: Insufficient documentation

## 2013-03-24 DIAGNOSIS — K921 Melena: Secondary | ICD-10-CM | POA: Insufficient documentation

## 2013-03-24 MED ORDER — VITAMIN D 1000 UNITS PO TABS: 1000.0000 [IU] | ORAL_TABLET | Freq: Every day | ORAL | Status: AC

## 2013-03-24 MED ORDER — HYDROCORTISONE ACETATE 25 MG RE SUPP: 25.0000 mg | Freq: Two times a day (BID) | RECTAL | Status: DC

## 2013-03-24 NOTE — Progress Notes (Signed)
   Subjective:    Patient ID: Carol Conner, female    DOB: 07/28/1960, 53 y.o.   MRN: [id number]  HPI  Not seen in 4 years C/o blood in stool x 3-4 days  Review of Systems  Constitutional: Negative for chills, activity change, appetite change, fatigue and unexpected weight change.  HENT: Negative for congestion, mouth sores and sinus pressure.   Eyes: Negative for visual disturbance.  Respiratory: Negative for cough and chest tightness.   Gastrointestinal: Positive for anal bleeding. Negative for nausea, abdominal pain and rectal pain.  Genitourinary: Negative for frequency, difficulty urinating and vaginal pain.  Musculoskeletal: Negative for back pain and gait problem.  Skin: Negative for pallor and rash.  Neurological: Negative for dizziness, tremors, weakness, numbness and headaches.  Psychiatric/Behavioral: Negative for suicidal ideas, confusion and sleep disturbance.       Objective:   Physical Exam  Constitutional: She appears well-developed. No distress.  HENT:  Head: Normocephalic.  Right Ear: External ear normal.  Left Ear: External ear normal.  Nose: Nose normal.  Mouth/Throat: Oropharynx is clear and moist.  Eyes: Conjunctivae are normal. Pupils are equal, round, and reactive to light. Right eye exhibits no discharge. Left eye exhibits no discharge.  Neck: Normal range of motion. Neck supple. No JVD present. No tracheal deviation present. No thyromegaly present.  Cardiovascular: Normal rate, regular rhythm and normal heart sounds.   Pulmonary/Chest: No stridor. No respiratory distress. She has no wheezes.  Abdominal: Soft. Bowel sounds are normal. She exhibits no distension and no mass. There is no tenderness. There is no rebound and no guarding.  Musculoskeletal: She exhibits no edema and no tenderness.  Lymphadenopathy:    She has no cervical adenopathy.  Neurological: She displays normal reflexes. No cranial nerve deficit. She exhibits normal muscle tone.  Coordination normal.  Skin: No rash noted. No erythema.  Psychiatric: She has a normal mood and affect. Her behavior is normal. Judgment and thought content normal.          Assessment & Plan:

## 2013-03-24 NOTE — Assessment & Plan Note (Signed)
Labs

## 2013-03-24 NOTE — Assessment & Plan Note (Signed)
A1c

## 2013-03-24 NOTE — Assessment & Plan Note (Signed)
GI ref Anusol HC

## 2013-03-24 NOTE — Progress Notes (Signed)
Pre visit review using our clinic review tool, if applicable. No additional management support is needed unless otherwise documented below in the visit note. 

## 2013-05-12 ENCOUNTER — Ambulatory Visit (AMBULATORY_SURGERY_CENTER): Payer: Self-pay

## 2013-05-12 VITALS — Ht 64.25 in | Wt 248.2 lb

## 2013-05-12 DIAGNOSIS — Z1211 Encounter for screening for malignant neoplasm of colon: Secondary | ICD-10-CM

## 2013-05-12 MED ORDER — SOD PICOSULFATE-MAG OX-CIT ACD 10-3.5-12 MG-GM-GM PO PACK: 1.0000 | PACK | Freq: Once | ORAL | Status: DC

## 2013-05-12 NOTE — Progress Notes (Signed)
No allergies to eggs or soy. Taking detox tea.  No appetite suppressants. No home oxygen.  C-pap only. Emmi instructions given for colonoscopy.  Has email.

## 2013-05-17 ENCOUNTER — Encounter: Payer: Self-pay | Admitting: Gastroenterology

## 2013-05-26 ENCOUNTER — Encounter: Payer: 59 | Admitting: Gastroenterology

## 2013-07-25 ENCOUNTER — Telehealth: Payer: Self-pay | Admitting: Gastroenterology

## 2013-07-25 ENCOUNTER — Encounter: Payer: 59 | Admitting: Gastroenterology

## 2013-07-25 NOTE — Telephone Encounter (Signed)
Charge the patient the standard [facility] no show fee

## 2013-09-02 ENCOUNTER — Emergency Department (INDEPENDENT_AMBULATORY_CARE_PROVIDER_SITE_OTHER)
Admission: EM | Admit: 2013-09-02 | Discharge: 2013-09-02 | Disposition: A | Discharge status: Home / Self Care | Payer: 59 | Source: Home / Self Care | Attending: Family Medicine | Admitting: Family Medicine

## 2013-09-02 ENCOUNTER — Encounter (HOSPITAL_COMMUNITY): Payer: Self-pay | Admitting: Emergency Medicine

## 2013-09-02 DIAGNOSIS — J029 Acute pharyngitis, unspecified: Secondary | ICD-10-CM

## 2013-09-02 LAB — POCT RAPID STREP A: Streptococcus, Group A Screen (Direct): NEGATIVE

## 2013-09-02 MED ORDER — CLINDAMYCIN HCL 300 MG PO CAPS: 300.0000 mg | ORAL_CAPSULE | Freq: Four times a day (QID) | ORAL | Status: DC

## 2013-09-02 NOTE — Discharge Instructions (Signed)
°  Exam does not suggest tonsillar or peritonsillar abscess, epiglottitis. Rapid strep negative. Will hold specimen for 3 day culture and contact by phone if results indicate need for additional treatment. Likely a viral pharyngitis, however, will provide Rx for clindamycin to cover for atypical causes of pharyngitis. You are advised to begin to using if symptoms do not resolve spontaneously over next 3-4 days or if fever develops Pharyngitis Pharyngitis is redness, pain, and swelling (inflammation) of your pharynx.  CAUSES  Pharyngitis is usually caused by infection. Most of the time, these infections are from viruses (viral) and are part of a cold. However, sometimes pharyngitis is caused by bacteria (bacterial). Pharyngitis can also be caused by allergies. Viral pharyngitis may be spread from person to person by coughing, sneezing, and personal items or utensils (cups, forks, spoons, toothbrushes). Bacterial pharyngitis may be spread from person to person by more intimate contact, such as kissing.  SIGNS AND SYMPTOMS  Symptoms of pharyngitis include:   Sore throat.   Tiredness (fatigue).   Low-grade fever.   Headache.  Joint pain and muscle aches.  Skin rashes.  Swollen lymph nodes.  Plaque-like film on throat or tonsils (often seen with bacterial pharyngitis). DIAGNOSIS  Your health care provider will ask you questions about your illness and your symptoms. Your medical history, along with a physical exam, is often all that is needed to diagnose pharyngitis. Sometimes, a rapid strep test is done. Other lab tests may also be done, depending on the suspected cause.  TREATMENT  Viral pharyngitis will usually get better in 3-4 days without the use of medicine. Bacterial pharyngitis is treated with medicines that kill germs (antibiotics).  HOME CARE INSTRUCTIONS   Drink enough water and fluids to keep your urine clear or pale yellow.   Only take over-the-counter or prescription  medicines as directed by your health care provider:   If you are prescribed antibiotics, make sure you finish them even if you start to feel better.   Do not take aspirin.   Get lots of rest.   Gargle with 8 oz of salt water ( tsp of salt per 1 qt of water) as often as every 1-2 hours to soothe your throat.   Throat lozenges (if you are not at risk for choking) or sprays may be used to soothe your throat. SEEK MEDICAL CARE IF:   You have large, tender lumps in your neck.  You have a rash.  You cough up green, yellow-brown, or bloody spit. SEEK IMMEDIATE MEDICAL CARE IF:   Your neck becomes stiff.  You drool or are unable to swallow liquids.  You vomit or are unable to keep medicines or liquids down.  You have severe pain that does not go away with the use of recommended medicines.  You have trouble breathing (not caused by a stuffy nose). MAKE SURE YOU:   Understand these instructions.  Will watch your condition.  Will get help right away if you are not doing well or get worse. Document Released: 01/05/2005 Document Revised: 10/26/2012 Document Reviewed: 09/12/2012 [facility] Patient Information 2015 [place], [place]. This information is not intended to replace advice given to you by your health care provider. Make sure you discuss any questions you have with your health care provider.

## 2013-09-02 NOTE — ED Notes (Signed)
Verified call back number for lab issues ; NAD at release

## 2013-09-02 NOTE — ED Provider Notes (Signed)
CSN: [id number]     Arrival date & time 09/02/13  0901 History   First MD Initiated Contact with Patient 09/02/13 0914     Chief Complaint  Patient presents with  . Sore Throat   (Consider location/radiation/quality/duration/timing/severity/associated sxs/prior Treatment) Patient is a 53 y.o. female presenting with pharyngitis. The history is provided by the patient.  Sore Throat This is a new problem. Episode onset: sx began 6 days ago. The problem occurs constantly. The problem has been gradually worsening (no fever). Associated symptoms comments: +associated nasal congestion, non-productive cough and post nasal drainage .    Past Medical History  Diagnosis Date  . Sleep apnea    Past Surgical History  Procedure Laterality Date  . Abdominal hysterectomy    . Abdominal hysterectomy    . Appendectomy     Family History  Problem Relation Age of Onset  . Breast cancer Mother   . Kidney disease Mother   . Cancer Father   . Pulmonary embolism Sister   . Colon cancer Neg Hx    History  Substance Use Topics  . Smoking status: Never Smoker   . Smokeless tobacco: Never Used  . Alcohol Use: No   OB History   Grav Para Term Preterm Abortions TAB SAB Ect Mult Living                 Review of Systems  All other systems reviewed and are negative.   Allergies  Review of patient's allergies indicates no known allergies.  Home Medications   Prior to Admission medications   Medication Sig Start Date End Date Taking? Authorizing Provider  cholecalciferol (VITAMIN D) 1000 UNITS tablet Take 1 tablet (1,000 Units total) by mouth daily. 03/24/13 03/24/14  Aleksei Plotnikov V, MD  clindamycin (CLEOCIN) 300 MG capsule Take 1 capsule (300 mg total) by mouth 4 (four) times daily. X 7days 09/02/13   Audelia Hives Giulian Goldring, PA  hydrocortisone (ANUSOL-HC) 25 MG suppository Place 1 suppository (25 mg total) rectally 2 (two) times daily. 03/24/13   Aleksei Plotnikov V, MD  loratadine (CLARITIN) 10  MG tablet Take 10 mg by mouth daily.    Historical Provider, MD  magnesium 30 MG tablet Take 250 mg by mouth at bedtime.    Historical Provider, MD  Omega-3 Fatty Acids (FISH OIL PO) Take by mouth daily.    Historical Provider, MD  Sod Picosulfate-Mag Ox-Cit Acd 10-3.5-12 MG-GM-GM PACK Take 1 kit by mouth once. 05/12/13   Ladene Artist, MD   BP 110/77  Pulse 90  Temp(Src) 98.3 F (36.8 C) (Oral)  Resp 18  SpO2 95% Physical Exam  Nursing note and vitals reviewed. Constitutional: She is oriented to person, place, and time. She appears well-developed and well-nourished. No distress.  HENT:  Head: Normocephalic and atraumatic.  Right Ear: Hearing, tympanic membrane, external ear and ear canal normal.  Left Ear: Hearing, tympanic membrane, external ear and ear canal normal.  Nose: Nose normal.  Mouth/Throat: Uvula is midline and mucous membranes are normal. No oral lesions. No trismus in the jaw. Normal dentition. No uvula swelling. Posterior oropharyngeal erythema present. No oropharyngeal exudate, posterior oropharyngeal edema or tonsillar abscesses.  Eyes: Conjunctivae are normal. No scleral icterus.  Neck: Normal range of motion. Neck supple.  Cardiovascular: Normal rate, regular rhythm and normal heart sounds.   Pulmonary/Chest: Effort normal and breath sounds normal. No stridor.  Musculoskeletal: Normal range of motion.  Lymphadenopathy:    She has no cervical adenopathy.  Neurological:  She is alert and oriented to person, place, and time.  Skin: Skin is warm and dry. No rash noted. No erythema.  Psychiatric: She has a normal mood and affect. Her behavior is normal.    ED Course  Procedures (including critical care time) Labs Review Labs Reviewed  POCT RAPID STREP A ([place])    Imaging Review No results found.   MDM   1. Pharyngitis    Patient seems very concerned about the severity of her sore throat, although her exam does not suggest tonsillar or  peritonsillar abscess, epiglottitis. She denies difficulty with breathing, speaking, or swallowing liquids or solids. Rapid strep negative. Will hold specimen for 3 day culture. Likely a viral pharyngitis, however, will provide patient Rx for clindamycin to cover for atypical causes of pharyngitis and advise patient that she may begin to use if symptoms do not resolve spontaneously over next 3-4 days or if fever develops.     [place], [place] 09/02/13 [phone]

## 2013-09-02 NOTE — ED Provider Notes (Signed)
Medical screening examination/treatment/procedure(s) were performed by resident physician or non-physician practitioner and as supervising physician I was immediately available for consultation/collaboration.   Pauline Good MD.   Billy Fischer, MD 09/02/13 1245

## 2013-09-02 NOTE — ED Notes (Signed)
States she has not felt well for [ast week or so; sore throat, congestion, ears hurt, worse at night; NAD. Worst sore throat of my life

## 2013-09-04 LAB — CULTURE, GROUP A STREP

## 2014-01-22 ENCOUNTER — Encounter: Payer: Self-pay | Admitting: Gastroenterology

## 2014-03-06 ENCOUNTER — Ambulatory Visit (AMBULATORY_SURGERY_CENTER): Payer: Self-pay

## 2014-03-06 VITALS — Ht 63.5 in | Wt 248.4 lb

## 2014-03-06 DIAGNOSIS — Z1211 Encounter for screening for malignant neoplasm of colon: Secondary | ICD-10-CM

## 2014-03-06 MED ORDER — MOVIPREP 100 G PO SOLR: ORAL | Status: DC

## 2014-03-06 NOTE — Progress Notes (Signed)
Per pt, no allergies to soy or egg products.Pt not taking any weight loss meds or using  O2 at home. 

## 2014-03-20 ENCOUNTER — Encounter: Payer: Self-pay | Admitting: Gastroenterology

## 2014-03-20 ENCOUNTER — Ambulatory Visit (AMBULATORY_SURGERY_CENTER): Payer: 59 | Admitting: Gastroenterology

## 2014-03-20 VITALS — BP 105/56 | HR 69 | Temp 97.4°F | Resp 14 | Ht 63.0 in | Wt 248.0 lb

## 2014-03-20 DIAGNOSIS — Z1211 Encounter for screening for malignant neoplasm of colon: Secondary | ICD-10-CM

## 2014-03-20 MED ORDER — SODIUM CHLORIDE 0.9 % IV SOLN: 500.0000 mL | INTRAVENOUS | Status: DC

## 2014-03-20 NOTE — Progress Notes (Signed)
To recovery, Report to [facility]. VSS

## 2014-03-20 NOTE — Patient Instructions (Signed)
YOU HAD AN ENDOSCOPIC PROCEDURE TODAY AT [facility]:   Refer to the procedure report that was given to you for any specific questions about what was found during the examination.  If the procedure report does not answer your questions, please call your gastroenterologist to clarify.  If you requested that your care partner not be given the details of your procedure findings, then the procedure report has been included in a sealed envelope for you to review at your convenience later.  YOU SHOULD EXPECT: Some feelings of bloating in the abdomen. Passage of more gas than usual.  Walking can help get rid of the air that was put into your GI tract during the procedure and reduce the bloating. If you had a lower endoscopy (such as a colonoscopy or flexible sigmoidoscopy) you may notice spotting of blood in your stool or on the toilet paper. If you underwent a bowel prep for your procedure, you may not have a normal bowel movement for a few days.  Please Note:  You might notice some irritation and congestion in your nose or some drainage.  This is from the oxygen used during your procedure.  There is no need for concern and it should clear up in a day or so.  SYMPTOMS TO REPORT IMMEDIATELY:   Following lower endoscopy (colonoscopy or flexible sigmoidoscopy):  Excessive amounts of blood in the stool  Significant tenderness or worsening of abdominal pains  Swelling of the abdomen that is new, acute  Fever of 100F or higher   A gastroenterologist can be reached at any hour by calling [phone].   DIET: Your first meal following the procedure should be a small meal and then it is ok to progress to your normal diet. Heavy or fried foods are harder to digest and may make you feel nauseous or bloated.  Likewise, meals heavy in dairy and vegetables can increase bloating.  Drink plenty of fluids but you should avoid alcoholic beverages for 24 hours. Increase the fiber in your  diet.  ACTIVITY:  You should plan to take it easy for the rest of today and you should NOT DRIVE or use heavy machinery until tomorrow (because of the sedation medicines used during the test).    FOLLOW UP: Our staff will call the number listed on your records the next business day following your procedure to check on you and address any questions or concerns that you may have regarding the information given to you following your procedure. If we do not reach you, we will leave a message.  However, if you are feeling well and you are not experiencing any problems, there is no need to return our call.  We will assume that you have returned to your regular daily activities without incident.  Read the handouts given to you by your recovery room nurse.  If any biopsies were taken you will be contacted by phone or by letter within the next 1-3 weeks.  Please call us at [phone] if you have not heard about the biopsies in 3 weeks.    SIGNATURES/CONFIDENTIALITY: You and/or your care partner have signed paperwork which will be entered into your electronic medical record.  These signatures attest to the fact that that the information above on your After Visit Summary has been reviewed and is understood.  Full responsibility of the confidentiality of this discharge information lies with you and/or your care-partner.

## 2014-03-20 NOTE — Progress Notes (Signed)
May discharge in 20 minutes per Dr. Fuller Plan.

## 2014-03-20 NOTE — Op Note (Signed)
[facility]  [address] [place], [address]   COLONOSCOPY PROCEDURE REPORT  PATIENT: Carol Conner, Carol Conner  MR#: [id number] BIRTHDATE: [birth year] , 53  yrs. old GENDER: female ENDOSCOPIST: Ladene Artist, MD, [facility] REFERRED MV:VKPQ Avel Sensor, M.D. PROCEDURE DATE:  03/20/2014 PROCEDURE:   Colonoscopy, screening First Screening Colonoscopy - Avg.  risk and is 50 yrs.  old or older Yes.  Prior Negative Screening - Now for repeat screening. N/A  History of Adenoma - Now for follow-up colonoscopy & has been > or = to 3 yrs.  N/A  Polyps Removed Today? No.  Polyps Removed Today? No.  Recommend repeat exam, <10 yrs? Polyps Removed Today? No.  Recommend repeat exam, <10 yrs? No. ASA CLASS:   Class II INDICATIONS:average risk patient for colorectal cancer. MEDICATIONS: Monitored anesthesia care and Propofol 200 mg IV DESCRIPTION OF PROCEDURE:   After the risks benefits and alternatives of the procedure were thoroughly explained, informed consent was obtained.  The digital rectal exam revealed no abnormalities of the rectum.   The LB [id number]  endoscope was introduced through the anus and advanced to the cecum, which was identified by both the appendix and ileocecal valve. No adverse events experienced.   The quality of the prep was excellent, using MoviPrep  The instrument was then slowly withdrawn as the colon was fully examined.    COLON FINDINGS: A normal appearing cecum, ileocecal valve, and appendiceal orifice were identified.  The ascending, transverse, descending, sigmoid colon, and rectum appeared unremarkable. Retroflexed views revealed no abnormalities. The time to cecum=1 minutes 58 seconds.  Withdrawal time=9 minutes 09 seconds.  The scope was withdrawn and the procedure completed. COMPLICATIONS: There were no immediate complications.  ENDOSCOPIC IMPRESSION: Normal colonoscopy  RECOMMENDATIONS: Continue current colorectal screening  recommendations for "routine risk" patients with a repeat colonoscopy in 10 years.  eSigned:  Ladene Artist, MD, [facility] 03/20/2014 10:39 AM

## 2014-03-21 ENCOUNTER — Telehealth: Payer: Self-pay

## 2014-03-21 NOTE — Telephone Encounter (Signed)
No answer, left voicemail message.

## 2014-03-27 ENCOUNTER — Ambulatory Visit: Payer: 59 | Admitting: Internal Medicine

## 2014-03-28 ENCOUNTER — Ambulatory Visit (INDEPENDENT_AMBULATORY_CARE_PROVIDER_SITE_OTHER): Payer: 59 | Admitting: Internal Medicine

## 2014-03-28 ENCOUNTER — Encounter: Payer: Self-pay | Admitting: Internal Medicine

## 2014-03-28 ENCOUNTER — Other Ambulatory Visit (INDEPENDENT_AMBULATORY_CARE_PROVIDER_SITE_OTHER): Payer: 59

## 2014-03-28 VITALS — BP 112/68 | HR 83 | Temp 97.8°F | Wt 248.0 lb

## 2014-03-28 DIAGNOSIS — R739 Hyperglycemia, unspecified: Secondary | ICD-10-CM

## 2014-03-28 DIAGNOSIS — K921 Melena: Secondary | ICD-10-CM | POA: Diagnosis not present

## 2014-03-28 DIAGNOSIS — D62 Acute posthemorrhagic anemia: Secondary | ICD-10-CM

## 2014-03-28 DIAGNOSIS — L0291 Cutaneous abscess, unspecified: Secondary | ICD-10-CM | POA: Insufficient documentation

## 2014-03-28 DIAGNOSIS — L0201 Cutaneous abscess of face: Secondary | ICD-10-CM

## 2014-03-28 LAB — IBC PANEL
Iron: 67 ug/dL (ref 42–145)
Saturation Ratios: 21.2 % (ref 20.0–50.0)
TRANSFERRIN: 226 mg/dL (ref 212.0–360.0)

## 2014-03-28 LAB — HEMOGLOBIN A1C: Hgb A1c MFr Bld: 6.2 % (ref 4.6–6.5)

## 2014-03-28 MED ORDER — DOXYCYCLINE HYCLATE 100 MG PO TABS: 100.0000 mg | ORAL_TABLET | Freq: Two times a day (BID) | ORAL | Status: DC

## 2014-03-28 MED ORDER — MUPIROCIN 2 % EX OINT: TOPICAL_OINTMENT | CUTANEOUS | Status: DC

## 2014-03-28 NOTE — Progress Notes (Signed)
   Subjective:    Patient ID: Carol Conner, female    DOB: 03-31-1960, 54 y.o.   MRN: [id number]  HPI  C/o bump on L cheek w/pus x 2 wks F/u blood in stool - resolved; pt had a colonoscopy 03/20/14  Review of Systems  Constitutional: Negative for chills, activity change, appetite change, fatigue and unexpected weight change.  HENT: Negative for congestion, mouth sores and sinus pressure.   Eyes: Negative for visual disturbance.  Respiratory: Negative for cough and chest tightness.   Gastrointestinal: Positive for anal bleeding. Negative for nausea, abdominal pain and rectal pain.  Genitourinary: Negative for frequency, difficulty urinating and vaginal pain.  Musculoskeletal: Negative for back pain and gait problem.  Skin: Negative for pallor and rash.  Neurological: Negative for dizziness, tremors, weakness, numbness and headaches.  Psychiatric/Behavioral: Negative for suicidal ideas, confusion and sleep disturbance.       Objective:   Physical Exam  Constitutional: She appears well-developed. No distress.  HENT:  Head: Normocephalic.  Right Ear: External ear normal.  Left Ear: External ear normal.  Nose: Nose normal.  Mouth/Throat: Oropharynx is clear and moist.  Eyes: Conjunctivae are normal. Pupils are equal, round, and reactive to light. Right eye exhibits no discharge. Left eye exhibits no discharge.  Neck: Normal range of motion. Neck supple. No JVD present. No tracheal deviation present. No thyromegaly present.  Cardiovascular: Normal rate, regular rhythm and normal heart sounds.   Pulmonary/Chest: No stridor. No respiratory distress. She has no wheezes.  Abdominal: Soft. Bowel sounds are normal. She exhibits no distension and no mass. There is no tenderness. There is no rebound and no guarding.  Musculoskeletal: She exhibits no edema or tenderness.  Lymphadenopathy:    She has no cervical adenopathy.  Neurological: She displays normal reflexes. No cranial nerve deficit.  She exhibits normal muscle tone. Coordination normal.  Skin: No rash noted. No erythema.  Psychiatric: She has a normal mood and affect. Her behavior is normal. Judgment and thought content normal.   A 7 mm papule w/a crust on L cheek - no abscess       Assessment & Plan:

## 2014-03-28 NOTE — Assessment & Plan Note (Signed)
L cheek - no need for I&D Start Doxy po and Mupirocin topically No need for I&D

## 2014-03-28 NOTE — Progress Notes (Signed)
Pre visit review using our clinic review tool, if applicable. No additional management support is needed unless otherwise documented below in the visit note. 

## 2014-04-09 ENCOUNTER — Other Ambulatory Visit (HOSPITAL_COMMUNITY): Payer: Self-pay | Admitting: Obstetrics and Gynecology

## 2014-04-09 DIAGNOSIS — Z1231 Encounter for screening mammogram for malignant neoplasm of breast: Secondary | ICD-10-CM

## 2014-04-26 ENCOUNTER — Encounter: Payer: Self-pay | Admitting: Internal Medicine

## 2014-04-26 ENCOUNTER — Ambulatory Visit (INDEPENDENT_AMBULATORY_CARE_PROVIDER_SITE_OTHER): Payer: 59 | Admitting: Internal Medicine

## 2014-04-26 VITALS — BP 110/78 | HR 82 | Wt 245.0 lb

## 2014-04-26 DIAGNOSIS — L0201 Cutaneous abscess of face: Secondary | ICD-10-CM | POA: Diagnosis not present

## 2014-04-26 MED ORDER — DOXYCYCLINE HYCLATE 100 MG PO TABS: 100.0000 mg | ORAL_TABLET | Freq: Two times a day (BID) | ORAL | Status: DC

## 2014-04-26 NOTE — Assessment & Plan Note (Signed)
Better, not resolved Will ref Dr Denna Haggard PO ABX if worse

## 2014-04-26 NOTE — Progress Notes (Signed)
   Subjective:    HPI  F/u bump on L cheek w/pus x6-8 wks: better w/abx F/u blood in stool - resolved; pt had a colonoscopy 03/20/14  Wt Readings from Last 3 Encounters:  04/26/14 245 lb (111.131 kg)  03/28/14 248 lb (112.492 kg)  03/20/14 248 lb (112.492 kg)   BP Readings from Last 3 Encounters:  04/26/14 110/78  03/28/14 112/68  03/20/14 105/56      Review of Systems  Constitutional: Negative for chills, activity change, appetite change, fatigue and unexpected weight change.  HENT: Negative for congestion, mouth sores and sinus pressure.   Eyes: Negative for visual disturbance.  Respiratory: Negative for cough and chest tightness.   Gastrointestinal: Positive for anal bleeding. Negative for nausea, abdominal pain and rectal pain.  Genitourinary: Negative for frequency, difficulty urinating and vaginal pain.  Musculoskeletal: Negative for back pain and gait problem.  Skin: Negative for pallor and rash.  Neurological: Negative for dizziness, tremors, weakness, numbness and headaches.  Psychiatric/Behavioral: Negative for suicidal ideas, confusion and sleep disturbance.       Objective:   Physical Exam  Constitutional: She appears well-developed. No distress.  HENT:  Head: Normocephalic.  Right Ear: External ear normal.  Left Ear: External ear normal.  Nose: Nose normal.  Mouth/Throat: Oropharynx is clear and moist.  Eyes: Conjunctivae are normal. Pupils are equal, round, and reactive to light. Right eye exhibits no discharge. Left eye exhibits no discharge.  Neck: Normal range of motion. Neck supple. No JVD present. No tracheal deviation present. No thyromegaly present.  Cardiovascular: Normal rate, regular rhythm and normal heart sounds.   Pulmonary/Chest: No stridor. No respiratory distress. She has no wheezes.  Abdominal: Soft. Bowel sounds are normal. She exhibits no distension and no mass. There is no tenderness. There is no rebound and no guarding.    Musculoskeletal: She exhibits no edema or tenderness.  Lymphadenopathy:    She has no cervical adenopathy.  Neurological: She displays normal reflexes. No cranial nerve deficit. She exhibits normal muscle tone. Coordination normal.  Skin: No rash noted. No erythema.  Psychiatric: She has a normal mood and affect. Her behavior is normal. Judgment and thought content normal.   A 6x6 mm papule w/a crust on L cheek - no abscess       Assessment & Plan:

## 2014-04-26 NOTE — Progress Notes (Signed)
Pre visit review using our clinic review tool, if applicable. No additional management support is needed unless otherwise documented below in the visit note. 

## 2014-05-01 ENCOUNTER — Ambulatory Visit (HOSPITAL_COMMUNITY)
Admission: RE | Admit: 2014-05-01 | Discharge: 2014-05-01 | Disposition: A | Discharge status: Home / Self Care | Payer: 59 | Source: Ambulatory Visit | Attending: Obstetrics and Gynecology | Admitting: Obstetrics and Gynecology

## 2014-05-01 DIAGNOSIS — Z1231 Encounter for screening mammogram for malignant neoplasm of breast: Secondary | ICD-10-CM

## 2014-05-02 ENCOUNTER — Other Ambulatory Visit: Payer: Self-pay | Admitting: Obstetrics and Gynecology

## 2014-05-02 DIAGNOSIS — R928 Other abnormal and inconclusive findings on diagnostic imaging of breast: Secondary | ICD-10-CM

## 2014-05-07 ENCOUNTER — Ambulatory Visit
Admission: RE | Admit: 2014-05-07 | Discharge: 2014-05-07 | Disposition: A | Discharge status: Home / Self Care | Payer: 59 | Source: Ambulatory Visit | Attending: Obstetrics and Gynecology | Admitting: Obstetrics and Gynecology

## 2014-05-07 DIAGNOSIS — R928 Other abnormal and inconclusive findings on diagnostic imaging of breast: Secondary | ICD-10-CM

## 2014-05-23 ENCOUNTER — Telehealth: Payer: Self-pay | Admitting: Internal Medicine

## 2014-05-23 NOTE — Telephone Encounter (Signed)
Called to let Dr. Camila Li know patient no showed today.

## 2016-06-30 ENCOUNTER — Ambulatory Visit (INDEPENDENT_AMBULATORY_CARE_PROVIDER_SITE_OTHER): Payer: 59 | Admitting: Internal Medicine

## 2016-06-30 ENCOUNTER — Encounter: Payer: Self-pay | Admitting: Internal Medicine

## 2016-06-30 VITALS — BP 126/88 | HR 70 | Ht 63.0 in | Wt 251.0 lb

## 2016-06-30 DIAGNOSIS — B373 Candidiasis of vulva and vagina: Secondary | ICD-10-CM | POA: Diagnosis not present

## 2016-06-30 DIAGNOSIS — B3731 Acute candidiasis of vulva and vagina: Secondary | ICD-10-CM | POA: Insufficient documentation

## 2016-06-30 MED ORDER — FLUCONAZOLE 150 MG PO TABS: ORAL_TABLET | ORAL | 1 refills | Status: DC

## 2016-06-30 NOTE — Progress Notes (Signed)
   Subjective:    Patient ID: Carol Conner, female    DOB: 06-14-1960, 56 y.o.   MRN: [id number]  HPI  Here with c/o vaginal d/c, not copious but off colored pasty type, c/w prior vaginal yeast infections. For which mult OTC preps have not worked, including some homeopathic tx.  Denies urinary symptoms such as dysuria, frequency, urgency, flank pain, hematuria or n/v, fever, chills.  Denies worsening reflux, abd pain, dysphagia, n/v, bowel change or blood. Past Medical History:  Diagnosis Date  . Heme positive stool   . Sleep apnea    uses c-pap   Past Surgical History:  Procedure Laterality Date  . abd infection     past hysterectomy  . ABDOMINAL HYSTERECTOMY    . APPENDECTOMY      reports that she has never smoked. She has never used smokeless tobacco. She reports that she does not drink alcohol or use drugs. family history includes Breast cancer in her mother; Cancer in her father; Kidney disease in her mother; Liver cancer in her father; Prostate cancer in her father; Pulmonary embolism in her sister. No Known Allergies Current Outpatient Prescriptions on File Prior to Visit  Medication Sig Dispense Refill  . loratadine (CLARITIN) 10 MG tablet Take 10 mg by mouth daily.    . magnesium 30 MG tablet Take 500 mg by mouth at bedtime.     . Melatonin 1 MG CAPS Take by mouth as needed.    . mupirocin ointment (BACTROBAN) 2 % Use qid 15 g 0  . Omega-3 Fatty Acids (FISH OIL PO) Take by mouth daily as needed.      No current facility-administered medications on file prior to visit.    Review of Systems All otherwise neg per pt     Objective:   Physical Exam BP 126/88   Pulse 70   Ht 5\' 3"  (1.6 m)   Wt 251 lb (113.9 kg)   SpO2 100%   BMI 44.46 kg/m  VS noted,  Constitutional: Pt appears in NAD HENT: Head: NCAT.  Right Ear: External ear normal.  Left Ear: External ear normal.  Eyes: . Pupils are equal, round, and reactive to light. Conjunctivae and EOM are normal Nose:  without d/c or deformity Neck: Neck supple. Gross normal ROM Cardiovascular: Normal rate and regular rhythm.   Pulmonary/Chest: Effort normal and breath sounds without rales or wheezing.  Abd:  Soft, NT, ND, + BS, no organomegaly Neurological: Pt is alert. At baseline orientation, motor grossly intact Skin: Skin is warm. No rashes, other new lesions, no LE edema Psychiatric: Pt behavior is normal without agitation  No other exam findings    Assessment & Plan:

## 2016-06-30 NOTE — Assessment & Plan Note (Signed)
Presumed, exam o/w benign, for diflucan asd, consider GYN referral if not improved

## 2016-06-30 NOTE — Patient Instructions (Signed)
Please take all new medication as prescribed - the diflucan  Please continue all other medications as before, and refills have been done if requested.  Please have the pharmacy call with any other refills you may need.  Please keep your appointments with your specialists as you may have planned

## 2016-07-28 ENCOUNTER — Ambulatory Visit (HOSPITAL_COMMUNITY)
Admission: EM | Admit: 2016-07-28 | Discharge: 2016-07-28 | Disposition: A | Discharge status: Home / Self Care | Payer: 59 | Attending: Internal Medicine | Admitting: Internal Medicine

## 2016-07-28 ENCOUNTER — Encounter (HOSPITAL_COMMUNITY): Payer: Self-pay | Admitting: *Deleted

## 2016-07-28 DIAGNOSIS — R0789 Other chest pain: Secondary | ICD-10-CM | POA: Diagnosis not present

## 2016-07-28 DIAGNOSIS — S29011A Strain of muscle and tendon of front wall of thorax, initial encounter: Secondary | ICD-10-CM | POA: Diagnosis not present

## 2016-07-28 NOTE — ED Triage Notes (Signed)
Pt    Reports   Pain  l   Side    Chest         Hurts  When  Laughs         Feels   Somewhat   Short  Of  Breath          Symptoms   Started  Yesterday     Pt  Ambulated  To  Room  With  Steady  Fluid  Gait

## 2016-07-28 NOTE — ED Provider Notes (Signed)
[place]    CSN: [id number] Arrival date & time: 07/28/16  1532     History   Chief Complaint Chief Complaint  Patient presents with  . Chest Pain    HPI Carol Conner is a 56 y.o. female with a history of OSA on CPAP presenting for left chest pain.   She reports diffuse left chest pain for 1 day worse with deep breaths, laughter, left arm movements and laying on left side. No dyspnea at rest or with exertion. No medications tried. Laying on right side decreases pain. Recently traveled by car to [place] and back last week but denies swelling or tenderness in legs.   No personal history of blood clot or cancer, but her sister died in her 47's of a PE. Nonsmoker.  HPI  Past Medical History:  Diagnosis Date  . Heme positive stool   . Sleep apnea    uses c-pap    Patient Active Problem List   Diagnosis Date Noted  . Vaginal yeast infection 06/30/2016  . Skin abscess 03/28/2014  . Hematochezia 03/24/2013  . Acute blood loss anemia 03/24/2013  . Hyperglycemia 03/24/2013  . OBSTRUCTIVE SLEEP APNEA 08/29/2009  . OVARIAN CYST 08/29/2009  . DYSPNEA 08/29/2009  . CHEST PAIN, UNSPECIFIED 03/06/2008  . OTITIS MEDIA, LEFT 10/13/2007  . ANXIETY 03/11/2007  . HEMORRHOIDS, NOS 03/11/2007  . ANAL STENOSIS 03/11/2007  . PROCTITIS 03/11/2007    Past Surgical History:  Procedure Laterality Date  . abd infection     past hysterectomy  . ABDOMINAL HYSTERECTOMY    . APPENDECTOMY      OB History    No data available       Home Medications    Prior to Admission medications   Medication Sig Start Date End Date Taking? Authorizing Provider  fluconazole (DIFLUCAN) 150 MG tablet 1 by mouth every 3 days as needed 06/30/16   Biagio Borg, MD  loratadine (CLARITIN) 10 MG tablet Take 10 mg by mouth daily.    [provider]  magnesium 30 MG tablet Take 500 mg by mouth at bedtime.     [provider]  Melatonin 1 MG CAPS Take by mouth as needed.     [provider]  mupirocin ointment (BACTROBAN) 2 % Use qid 03/28/14   Plotnikov, Evie Lacks, MD  Omega-3 Fatty Acids (FISH OIL PO) Take by mouth daily as needed.     [provider]    Family History Family History  Problem Relation Age of Onset  . Breast cancer Mother   . Kidney disease Mother   . Cancer Father   . Prostate cancer Father   . Liver cancer Father   . Pulmonary embolism Sister   . Colon cancer Neg Hx     Social History Social History  Substance Use Topics  . Smoking status: Never Smoker  . Smokeless tobacco: Never Used  . Alcohol use No     Allergies   Patient has no known allergies.   Review of Systems Review of Systems No cough, wheezing, fever, orthopnea, leg swelling, palpitations.   Physical Exam Triage Vital Signs ED Triage Vitals  Enc Vitals Group     BP 07/28/16 1546 114/76     Pulse Rate 07/28/16 1546 92     Resp 07/28/16 1546 (!) 22     Temp 07/28/16 1546 98.6 F (37 C)     Temp Source 07/28/16 1546 Oral     SpO2 07/28/16 1546 98 %  Weight --      Height --      Head Circumference --      Peak Flow --      Pain Score 07/28/16 1548 7     Pain Loc --      Pain Edu? --      Excl. in [place]? --    No data found.   Updated Vital Signs BP 114/76 (BP Location: Right Arm)   Pulse 92   Temp 98.6 F (37 C) (Oral)   Resp (!) 22   SpO2 98%   Physical Exam Gen: Well-appearing 56 y.o.female in NAD HEENT: MMM, sclerae/conjunctivae clear, posterior oropharynx clear, normal dentition Neck: neck supple, brisk carotid upstroke; thyroid not enlarged  Pulm: Non-labored on room air; normal RR; CTAB, no wheezes  CV: Regular rate, no murmur, rub or gallop; no LE edema, no JVD; distal pulses 2+, no supraclavicular subcutaneous emphysema on palpation.    GI: + BS; soft, NT, ND, no HSM Skin: No vesicular lesions, erythema or signs of trauma noted. Nomidline chest incision. Nosigns of venous insufficiency.  MSK: No reproduction of  chest pain on palpation, though pain reproduced with left shoulder abduction, flexion; No lower extremity swelling or pain on palpation, homan's sign negative.  Neuro: Alert and oriented x4, speech and gait are normal, strength symmetric 5/5 throughout, sensation intact to light touch throughout, romberg neg  Psych: A&Ox3, mood euthymic with congruent affect, not anxious, no psychomotor agitation.   ECG: Regular rate, NSR, normal axis or BBB, QTc not prolonged, no ST segment changes, T wave flattening in III. No old ECG available.   UC Treatments / Results  Labs (all labs ordered are listed, but only abnormal results are displayed) Labs Reviewed - No data to display  EKG  EKG Interpretation None       Radiology No results found.  Procedures Procedures (including critical care time)  Medications Ordered in UC Medications - No data to display   Initial Impression / Assessment and Plan / UC Course  I have reviewed the triage vital signs and the nursing notes.  Pertinent labs & imaging results that were available during my care of the patient were reviewed by me and considered in my medical decision making (see chart for details).  Final Clinical Impressions(s) / UC Diagnoses   Final diagnoses:  Pectoralis muscle strain, initial encounter  Musculoskeletal chest pain   Given that alternative diagnosis (musculoskeletal chest pain) is at least as likely as DVT and no leg swelling or pain, negative exam, Wells score indicates low probability of DVT. ECG with NSR and no ST depressions/elevations. Vitals indicate no hypoxemia. Exam indicates pain elicited by resisted left arm abduction, and engagement of pectoralis musculature, suspect this is muscular strain.  - Warm compresses and ibuprofen prn - Return precautions advised   Patrecia Pour, MD 07/28/16 1630

## 2017-01-06 ENCOUNTER — Encounter: Payer: Self-pay | Admitting: Family

## 2017-01-06 ENCOUNTER — Ambulatory Visit (INDEPENDENT_AMBULATORY_CARE_PROVIDER_SITE_OTHER): Payer: 59 | Admitting: Family

## 2017-01-06 VITALS — BP 126/80 | HR 106 | Temp 99.3°F | Ht 63.0 in | Wt 245.0 lb

## 2017-01-06 DIAGNOSIS — R52 Pain, unspecified: Secondary | ICD-10-CM

## 2017-01-06 DIAGNOSIS — R509 Fever, unspecified: Secondary | ICD-10-CM | POA: Diagnosis not present

## 2017-01-06 LAB — POC INFLUENZA A&B (BINAX/QUICKVUE)
INFLUENZA A, POC: NEGATIVE
Influenza B, POC: NEGATIVE

## 2017-01-06 MED ORDER — OSELTAMIVIR PHOSPHATE 75 MG PO CAPS: 75.0000 mg | ORAL_CAPSULE | Freq: Two times a day (BID) | ORAL | 0 refills | Status: DC

## 2017-01-06 NOTE — Progress Notes (Signed)
Carol Conner is a 56 y.o. female with the following history as recorded in [employer]:  Patient Active Problem List   Diagnosis Date Noted  . Vaginal yeast infection 06/30/2016  . Skin abscess 03/28/2014  . Hematochezia 03/24/2013  . Acute blood loss anemia 03/24/2013  . Hyperglycemia 03/24/2013  . OBSTRUCTIVE SLEEP APNEA 08/29/2009  . OVARIAN CYST 08/29/2009  . DYSPNEA 08/29/2009  . CHEST PAIN, UNSPECIFIED 03/06/2008  . OTITIS MEDIA, LEFT 10/13/2007  . ANXIETY 03/11/2007  . HEMORRHOIDS, NOS 03/11/2007  . ANAL STENOSIS 03/11/2007  . PROCTITIS 03/11/2007    Current Outpatient Medications  Medication Sig Dispense Refill  . magnesium 30 MG tablet Take 500 mg by mouth at bedtime.     . Omega-3 Fatty Acids (FISH OIL PO) Take by mouth daily as needed.     Marland Kitchen oseltamivir (TAMIFLU) 75 MG capsule Take 1 capsule (75 mg total) by mouth 2 (two) times daily. 10 capsule 0   No current facility-administered medications for this visit.     Allergies: Patient has no known allergies.  Past Medical History:  Diagnosis Date  . Heme positive stool   . Sleep apnea    uses c-pap    Past Surgical History:  Procedure Laterality Date  . abd infection     past hysterectomy  . ABDOMINAL HYSTERECTOMY    . APPENDECTOMY      Family History  Problem Relation Age of Onset  . Breast cancer Mother   . Kidney disease Mother   . Cancer Father   . Prostate cancer Father   . Liver cancer Father   . Pulmonary embolism Sister   . Colon cancer Neg Hx     Social History   Tobacco Use  . Smoking status: Never Smoker  . Smokeless tobacco: Never Used  Substance Use Topics  . Alcohol use: No    Subjective:  1 day history of body aches, cough, chills, fever; "just couldn't get warm last night." did not take the flu shot; has heard herself wheezing; increased fatigue,  Objective:  Vitals:   01/06/17 1554  BP: 126/80  Pulse: (!) 106  Temp: 99.3 F (37.4 C)  TempSrc: Oral  SpO2: 99%  Weight: 245  lb 0.6 oz (111.1 kg)  Height: 5\' 3"  (1.6 m)    General: Well developed, well nourished, in no acute distress  Skin : Warm and dry.  Head: Normocephalic and atraumatic  Eyes: Sclera and conjunctiva clear; pupils round and reactive to light; extraocular movements intact  Ears: External normal; canals clear; tympanic membranes normal  Oropharynx: Pink, supple. No suspicious lesions  Neck: Supple without thyromegaly, adenopathy  Lungs: Respirations unlabored; clear to auscultation bilaterally without wheeze, rales, rhonchi  CVS exam: normal rate and regular rhythm.  Abdomen: Soft; nontender; nondistended; normoactive bowel sounds; no masses or hepatosplenomegaly  Musculoskeletal: No deformities; no active joint inflammation  Extremities: No edema, cyanosis, clubbing  Vessels: Symmetric bilaterally  Neurologic: Alert and oriented; speech intact; face symmetrical; moves all extremities well; CNII-XII intact without focal deficit  Assessment:  1. Fever, unspecified fever cause   2. Body aches     Plan:  Flu test is negative; however, based on clinical presentation, will go ahead and treat; Rx for Tamiflu 75 mg bid x 5 days; increase fluids, rest and follow-up worse, no better.    No Follow-up on file.  Orders Placed This Encounter  Procedures  . POC Influenza A&B (Binax test)    Requested Prescriptions   Signed Prescriptions  Disp Refills  . oseltamivir (TAMIFLU) 75 MG capsule 10 capsule 0    Sig: Take 1 capsule (75 mg total) by mouth 2 (two) times daily.

## 2017-01-08 ENCOUNTER — Ambulatory Visit: Payer: 59 | Admitting: Internal Medicine

## 2018-05-05 ENCOUNTER — Encounter: Payer: Self-pay | Admitting: Internal Medicine

## 2018-05-05 ENCOUNTER — Ambulatory Visit (INDEPENDENT_AMBULATORY_CARE_PROVIDER_SITE_OTHER): Payer: 59 | Admitting: Internal Medicine

## 2018-05-05 DIAGNOSIS — M25562 Pain in left knee: Secondary | ICD-10-CM | POA: Diagnosis not present

## 2018-05-05 MED ORDER — MELOXICAM 15 MG PO TABS: 15.0000 mg | ORAL_TABLET | Freq: Every day | ORAL | 0 refills | Status: DC | PRN

## 2018-05-05 MED ORDER — VITAMIN D3 50 MCG (2000 UT) PO CAPS: 2000.0000 [IU] | ORAL_CAPSULE | Freq: Every day | ORAL | 3 refills | Status: AC

## 2018-05-05 NOTE — Progress Notes (Signed)
Virtual Visit via Telephone Note  I connected with Carol Conner on 05/05/18 at 10:40 AM EDT by telephone and verified that I am speaking with the correct person using two identifiers.   I discussed the limitations, risks, security and privacy concerns of performing an evaluation and management service by telephone and the availability of in person appointments. I also discussed with the patient that there may be a patient responsible charge related to this service. The patient expressed understanding and agreed to proceed.   History of Present Illness:   The patient has developed left knee pain last week when she tried to exercise more.  No swelling Observations/Objective:  The patient is in no acute distress.  The left knee looks normal via camera Assessment and Plan:  See plan. Schedule a well exam with labs in 2 to 3 months Follow Up Instructions:    I discussed the assessment and treatment plan with the patient. The patient was provided an opportunity to ask questions and all were answered. The patient agreed with the plan and demonstrated an understanding of the instructions.   The patient was advised to call back or seek an in-person evaluation if the symptoms worsen or if the condition fails to improve as anticipated.  I provided 15 minutes of non-face-to-face time during this encounter.   [name], MD

## 2018-05-05 NOTE — Assessment & Plan Note (Signed)
acute pain, possible strain Meloxicam as needed PC Knee brace Ice or heat Vitamin D

## 2019-06-14 ENCOUNTER — Ambulatory Visit (INDEPENDENT_AMBULATORY_CARE_PROVIDER_SITE_OTHER): Payer: 59

## 2019-06-14 ENCOUNTER — Other Ambulatory Visit: Payer: Self-pay

## 2019-06-14 ENCOUNTER — Ambulatory Visit: Payer: 59 | Admitting: Internal Medicine

## 2019-06-14 ENCOUNTER — Encounter: Payer: Self-pay | Admitting: Internal Medicine

## 2019-06-14 DIAGNOSIS — G8929 Other chronic pain: Secondary | ICD-10-CM

## 2019-06-14 DIAGNOSIS — M25562 Pain in left knee: Secondary | ICD-10-CM | POA: Diagnosis not present

## 2019-06-14 MED ORDER — NABUMETONE 500 MG PO TABS: 500.0000 mg | ORAL_TABLET | Freq: Two times a day (BID) | ORAL | 1 refills | Status: DC | PRN

## 2019-06-14 NOTE — Assessment & Plan Note (Signed)
?  patellofem syndrome eIlastic brace - pt  declined Exercises X ray Sports med ref [place] prn

## 2019-06-14 NOTE — Progress Notes (Signed)
Subjective:  Patient ID: Carol Conner, female    DOB: 06-May-1960  Age: 59 y.o. MRN: [id number]  CC: Knee Pain (left knee popping )   HPI Carol Conner presents for L knee pain worse x 1 year, worse w/walking, popping sound  Outpatient Medications Prior to Visit  Medication Sig Dispense Refill   Cholecalciferol (VITAMIN D3) 50 MCG (2000 UT) capsule Take 1 capsule (2,000 Units total) by mouth daily. 100 capsule 3   magnesium 30 MG tablet Take 500 mg by mouth at bedtime.      meloxicam (MOBIC) 15 MG tablet Take 1 tablet (15 mg total) by mouth daily as needed for pain. 30 tablet 0   OIL OF [place]ANO PO Take 2 each by mouth 3 times/day as needed-between meals & bedtime.     No facility-administered medications prior to visit.    ROS: Review of Systems  Constitutional: Negative for activity change, appetite change, chills, fatigue and unexpected weight change.  HENT: Negative for congestion, mouth sores and sinus pressure.   Eyes: Negative for visual disturbance.  Respiratory: Negative for cough and chest tightness.   Gastrointestinal: Negative for abdominal pain and nausea.  Genitourinary: Negative for difficulty urinating, frequency and vaginal pain.  Musculoskeletal: Positive for arthralgias and gait problem. Negative for back pain.  Skin: Negative for pallor and rash.  Neurological: Negative for dizziness, tremors, weakness, numbness and headaches.  Psychiatric/Behavioral: Negative for confusion and sleep disturbance.    Objective:  BP 100/60 (BP Location: Left Arm, Patient Position: Sitting, Cuff Size: Large)   Pulse 96   Temp 98.5 F (36.9 C) (Oral)   Ht 5\' 3"  (1.6 m)   Wt 252 lb (114.3 kg)   SpO2 96%   BMI 44.64 kg/m   BP Readings from Last 3 Encounters:  06/14/19 100/60  01/06/17 126/80  07/28/16 114/76    Wt Readings from Last 3 Encounters:  06/14/19 252 lb (114.3 kg)  01/06/17 245 lb 0.6 oz (111.1 kg)  06/30/16 251 lb (113.9 kg)    Physical  Exam Constitutional:      General: She is not in acute distress.    Appearance: She is well-developed.  HENT:     Head: Normocephalic.     Right Ear: External ear normal.     Left Ear: External ear normal.     Nose: Nose normal.  Eyes:     General:        Right eye: No discharge.        Left eye: No discharge.     Conjunctiva/sclera: Conjunctivae normal.     Pupils: Pupils are equal, round, and reactive to light.  Neck:     Thyroid: No thyromegaly.     Vascular: No JVD.     Trachea: No tracheal deviation.  Cardiovascular:     Rate and Rhythm: Normal rate and regular rhythm.     Heart sounds: Normal heart sounds.  Pulmonary:     Effort: No respiratory distress.     Breath sounds: No stridor. No wheezing.  Abdominal:     General: Bowel sounds are normal. There is no distension.     Palpations: Abdomen is soft. There is no mass.     Tenderness: There is no abdominal tenderness. There is no guarding or rebound.  Musculoskeletal:        General: Tenderness present.     Cervical back: Normal range of motion and neck supple.  Lymphadenopathy:     Cervical: No cervical adenopathy.  Skin:    Findings: No erythema or rash.  Neurological:     Cranial Nerves: No cranial nerve deficit.     Motor: No abnormal muscle tone.     Coordination: Coordination normal.     Deep Tendon Reflexes: Reflexes normal.  Psychiatric:        Behavior: Behavior normal.        Thought Content: Thought content normal.        Judgment: Judgment normal.      L knee grinding sound, pain w/ROM Lab Results  Component Value Date   WBC 5.7 09/13/2009   HGB 9.9 (L) 09/13/2009   HCT 30.3 (L) 09/13/2009   PLT 346 09/13/2009   GLUCOSE 92 09/10/2009   ALT 24 09/10/2009   AST 18 09/10/2009   NA 135 09/10/2009   K 4.2 09/10/2009   CL 103 09/10/2009   CREATININE 0.95 09/10/2009   BUN 6 09/10/2009   CO2 27 09/10/2009   HGBA1C 6.2 03/28/2014    No results found.  Assessment & Plan:      Carol Conner

## 2019-06-14 NOTE — Patient Instructions (Signed)
Patellofemoral Pain Syndrome  Patellofemoral pain syndrome is a condition in which the tissue (cartilage) on the underside of the kneecap (patella) softens or breaks down. This causes pain in the front of the knee. The condition is also called runner's knee or chondromalacia patella. Patellofemoral pain syndrome is most common in young adults who are active in sports. The knee is the largest joint in the body. The patella covers the front of the knee and is attached to muscles above and below the knee. The underside of the patella is covered with a smooth type of cartilage (synovium). The smooth surface helps the patella to glide easily when you move your knee. Patellofemoral pain syndrome causes swelling in the joint linings and bone surfaces in the knee. What are the causes? This condition may be caused by:  Overuse of the knee.  Poor alignment of your knee joints.  Weak leg muscles.  A direct blow to your kneecap. What increases the risk? You are more likely to develop this condition if:  You do a lot of activities that can wear down your kneecap. These include: ? Running. ? Squatting. ? Climbing stairs.  You start a new physical activity or exercise program.  You wear shoes that do not fit well.  You do not have good leg strength.  You are overweight. What are the signs or symptoms? The main symptom of this condition is knee pain. This may feel like a dull, aching pain underneath your patella, in the front of your knee. There may be a popping or cracking sound when you move your knee. Pain may get worse with:  Exercise.  Climbing stairs.  Running.  Jumping.  Squatting.  Kneeling.  Sitting for a long time.  Moving or pushing on your patella. How is this diagnosed? This condition may be diagnosed based on:  Your symptoms and medical history. You may be asked about your recent physical activities and which ones cause knee pain.  A physical exam. This may  include: ? Moving your patella back and forth. ? Checking your range of knee motion. ? Having you squat or jump to see if you have pain. ? Checking the strength of your leg muscles.  Imaging tests to confirm the diagnosis. These may include an MRI of your knee. How is this treated? This condition may be treated at home with rest, ice, compression, and elevation (Dwan).  Other treatments may include:  Nonsteroidal anti-inflammatory drugs (NSAIDs).  Physical therapy to stretch and strengthen your leg muscles.  Shoe inserts (orthotics) to take stress off your knee.  A knee brace or knee support.  Adhesive tapes to the skin.  Surgery to remove damaged cartilage or move the patella to a better position. This is rare. Follow these instructions at home: If you have a shoe or brace:  Wear the shoe or brace as told by your health care provider. Remove it only as told by your health care provider.  Loosen the shoe or brace if your toes tingle, become numb, or turn cold and blue.  Keep the shoe or brace clean.  If the shoe or brace is not waterproof: ? Do not let it get wet. ? Cover it with a watertight covering when you take a bath or a shower. Managing pain, stiffness, and swelling  If directed, put ice on the painful area. ? If you have a removable shoe or brace, remove it as told by your health care provider. ? Put ice in a plastic bag. ?  Place a towel between your skin and the bag. ? Leave the ice on for 20 minutes, 2-3 times a day.  Move your toes often to avoid stiffness and to lessen swelling.  Rest your knee: ? Avoid activities that cause knee pain. ? When sitting or lying down, raise (elevate) the injured area above the level of your heart, whenever possible. General instructions  Take over-the-counter and prescription medicines only as told by your health care provider.  Use splints, braces, knee supports, or walking aids as directed by your health care  provider.  Perform stretching and strengthening exercises as told by your health care provider or physical therapist.  Do not use any products that contain nicotine or tobacco, such as cigarettes and e-cigarettes. These can delay healing. If you need help quitting, ask your health care provider.  Return to your normal activities as told by your health care provider. Ask your health care provider what activities are safe for you.  Keep all follow-up visits as told by your health care provider. This is important. Contact a health care provider if:  Your symptoms get worse.  You are not improving with home care. Summary  Patellofemoral pain syndrome is a condition in which the tissue (cartilage) on the underside of the kneecap (patella) softens or breaks down.  This condition causes swelling in the joint linings and bone surfaces in the knee. This leads to pain in the front of the knee.  This condition may be treated at home with rest, ice, compression, and elevation (Reisner).  Use splints, braces, knee supports, or walking aids as directed by your health care provider. This information is not intended to replace advice given to you by your health care provider. Make sure you discuss any questions you have with your health care provider. Document Revised: 02/15/2017 Document Reviewed: 02/15/2017 Elsevier Patient Education  2020 [employer]  

## 2019-07-11 ENCOUNTER — Ambulatory Visit (INDEPENDENT_AMBULATORY_CARE_PROVIDER_SITE_OTHER): Payer: 59 | Admitting: Family Medicine

## 2019-07-11 ENCOUNTER — Ambulatory Visit: Payer: Self-pay

## 2019-07-11 ENCOUNTER — Encounter: Payer: Self-pay | Admitting: Family Medicine

## 2019-07-11 ENCOUNTER — Other Ambulatory Visit: Payer: Self-pay

## 2019-07-11 VITALS — BP 94/62 | HR 87 | Ht 63.0 in | Wt 250.4 lb

## 2019-07-11 DIAGNOSIS — M25562 Pain in left knee: Secondary | ICD-10-CM | POA: Diagnosis not present

## 2019-07-11 DIAGNOSIS — G8929 Other chronic pain: Secondary | ICD-10-CM

## 2019-07-11 DIAGNOSIS — M1712 Unilateral primary osteoarthritis, left knee: Secondary | ICD-10-CM | POA: Insufficient documentation

## 2019-07-11 MED ORDER — PENNSAID 2 % EX SOLN: 1.0000 "application " | Freq: Two times a day (BID) | CUTANEOUS | 2 refills | Status: DC

## 2019-07-11 NOTE — Patient Instructions (Addendum)
Thank you for coming in today. Try Pennsaid or over the counter voltaren gel if you have trouble getting the pennsaid.   Plan for limited PT.  Work on Astronomer and weight loss.  Goal is 1700 calories per day.  Use calorie application like myfitness pal  Recheck with me in about 6 weeks.  Return or contact me sooner if needed.   Pennsaid instructions: You have been given a sample/prescription for Pennsaid, a topical medication.     You are to apply this gel to your injured body part twice daily (morning and evening).   A little goes a long way so you can use about a pea-sized amount for each area.   Spread this small amount over the area into a thin film and let it dry.   Be sure that you do not rub the gel into your skin for more than 10 or 15 seconds otherwise it can irritate you skin.    Once you apply the gel, please do not put any other lotion or clothing in contact with that area for 30 minutes to allow the gel to absorb into your skin.   Some people are sensitive to the medication and can develop a sunburn-like rash.  If you have only mild symptoms it is okay to continue to use the medication but if you have any breakdown of your skin you should discontinue its use and please let us know.   If you have been written a prescription for Pennsaid, you will receive a pump bottle of this topical gel through a mail order pharmacy.  The instructions on the bottle will say to apply two pumps twice a day which may be too much gel for your particular area so use the pea-sized amount as your guide.   Instructions for Duexis, Pennsaid and Vimovo:  Your prescription will be filled through a participating [facility] mail order pharmacy.  You will receive a phone call or text from one of the participating pharmacies which can be located in any state in the [place].  You must communicate directly with them to have this medication filled.  When the pharmacy contacts you, they will  need your mailing address (for shipment of the medication) andy they will need payment information if you have a copay (typically no more than $10). If you have not heard from them 2-3 days after your appointment with Dr. Georgina Snell, contact [facility] directly at [phone].

## 2019-07-11 NOTE — Progress Notes (Signed)
Subjective:    I'm seeing this patient as a consultation for:  Dr. Alain Marion. Note will be routed back to referring provider/PCP.  CC: L knee pain  I, Molly Weber, LAT, ATC, am serving as scribe for Dr. Lynne Leader.  HPI: Pt is a 59 y/o female presenting w/ chronic L knee pain x approximately 5 months when she started walking for exercise.  She locates her pain to her L anterior knee.  She works as a Engineer, site.  Radiating pain: yes into her L lower leg and ant thigh L knee swelling: minimal L knee mechanical symptoms: yes Aggravating factors: walking; standing Treatments tried: HEP provided by PCP; patellar tendon strap; knee brace; Meloxicam  Diagnostic testing: L knee XR- 06/14/19  Past medical history, Surgical history, Family history, Social history, Allergies, and medications have been entered into the medical record, reviewed.   Review of Systems: No new headache, visual changes, nausea, vomiting, diarrhea, constipation, dizziness, abdominal pain, skin rash, fevers, chills, night sweats, weight loss, swollen lymph nodes, body aches, joint swelling, muscle aches, chest pain, shortness of breath, mood changes, visual or auditory hallucinations.   Objective:    Vitals:   07/11/19 1004  BP: 94/62  Pulse: 87  SpO2: 94%  Body mass index is 44.36 kg/m.  General: Well Developed, well nourished, and in no acute distress.  Neuro/Psych: Alert and oriented x3, extra-ocular muscles intact, able to move all 4 extremities, sensation grossly intact. Skin: Warm and dry, no rashes noted.  Respiratory: Not using accessory muscles, speaking in full sentences, trachea midline.  Cardiovascular: Pulses palpable, no extremity edema. Abdomen: Does not appear distended. MSK: Left knee mild VMO atrophy otherwise normal-appearing without effusion. Range of motion 0-120 degrees with crepitation. Mildly tender palpation at area just lateral to the patellar tendon at the anterior knee.   Patellar tendon and patella itself are nontender. Stable ligamentous exam. Negative McMurray's test. Strength extension 4/5 with pain.  5/5 to flexion. Normal gait.  Lab and Radiology Results  DG Knee 1-2 Views Left  Result Date: 06/15/2019 CLINICAL DATA:  Left knee pain, remote injury EXAM: LEFT KNEE - 1-2 VIEW COMPARISON:  None. FINDINGS: No acute bony abnormality. Specifically, no fracture, subluxation, or dislocation. Mild tricompartmental degenerative changes with spurring of the tibial spines and posterior articular facet of the patella some mild subchondral sclerosis along the articular surface of the patella may reflect some chondral insufficiency. Bidirectional patellar enthesophytes are noted. Soft tissues are unremarkable without sizable joint effusion. IMPRESSION: 1. No acute bony abnormality. 2. Mild tricompartmental degenerative changes with possible chondral insufficiency in the patellofemoral compartment. 3. Patellar enthesophytes. Electronically Signed   By: Lovena Le M.D.   On: 06/15/2019 05:01   I, Lynne Leader, personally (independently) visualized and performed the interpretation of the images attached in this note.  Diagnostic Limited MSK Ultrasound of: Left knee Quad tendon intact normal-appearing without effusion present at superior patellar space. Patella and physiologic changes at proximal patellar tendon at inferior patellar pole otherwise normal-appearing Lateral joint line normal-appearing without obvious meniscus tear. Medial joint line narrowed with degenerative appearing medial meniscus. Posterior knee no Baker's cyst. Impression: Mild degenerative changes knee and enthesopathy changes at patella   Impression and Recommendations:    Assessment and Plan: 59 y.o. female with left anterior knee pain.  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  After discussion with patient and her options we will proceed with  conservative management.  We will use  Pennsaid or Voltaren gel.  Additionally will refer to physical therapy to work on quad and hip strengthening without hurting her knee and for home exercise teaching.  Additionally discussed weight loss.  Weight management will drastically improve knee pain.  Discussed calorie modification.  Set calorie goal for 1800 cal/day with calorie counting application.  Recheck back in 4 to 6 weeks.  If not improved would consider injection.   PDMP not reviewed this encounter. Orders Placed This Encounter  Procedures  . [place] LIMITED JOINT SPACE STRUCTURES LOW LEFT(NO LINKED CHARGES)    Order Specific Question:   Reason for Exam (SYMPTOM  OR DIAGNOSIS REQUIRED)    Answer:   L knee pain    Order Specific Question:   Preferred imaging location?    Answer:   [facility]  . Ambulatory referral to Physical Therapy    Referral Priority:   Routine    Referral Type:   Physical Medicine    Referral Reason:   Specialty Services Required    Requested Specialty:   Physical Therapy   Meds ordered this encounter  Medications  . Diclofenac Sodium (PENNSAID) 2 % SOLN    Sig: Place 1 application onto the skin 2 (two) times daily.    Dispense:  112 g    Refill:  2    Home Phone      [phone] Work Phone      [phone] Mobile          [phone]     Discussed warning signs or symptoms. Please see discharge instructions. Patient expresses understanding.   The above documentation has been reviewed and is accurate and complete Lynne Leader, M.D.

## 2019-07-24 ENCOUNTER — Encounter (HOSPITAL_BASED_OUTPATIENT_CLINIC_OR_DEPARTMENT_OTHER): Payer: Self-pay | Admitting: Emergency Medicine

## 2019-07-24 ENCOUNTER — Emergency Department (HOSPITAL_BASED_OUTPATIENT_CLINIC_OR_DEPARTMENT_OTHER)
Admission: EM | Admit: 2019-07-24 | Discharge: 2019-07-24 | Disposition: A | Discharge status: Home / Self Care | Payer: 59 | Attending: Emergency Medicine | Admitting: Emergency Medicine

## 2019-07-24 ENCOUNTER — Emergency Department (HOSPITAL_BASED_OUTPATIENT_CLINIC_OR_DEPARTMENT_OTHER): Payer: 59

## 2019-07-24 ENCOUNTER — Other Ambulatory Visit: Payer: Self-pay

## 2019-07-24 DIAGNOSIS — R109 Unspecified abdominal pain: Secondary | ICD-10-CM | POA: Diagnosis not present

## 2019-07-24 DIAGNOSIS — M549 Dorsalgia, unspecified: Secondary | ICD-10-CM | POA: Diagnosis not present

## 2019-07-24 DIAGNOSIS — R42 Dizziness and giddiness: Secondary | ICD-10-CM | POA: Diagnosis not present

## 2019-07-24 DIAGNOSIS — R0602 Shortness of breath: Secondary | ICD-10-CM | POA: Diagnosis present

## 2019-07-24 LAB — CBC WITH DIFFERENTIAL/PLATELET
Abs Immature Granulocytes: 0.02 10*3/uL (ref 0.00–0.07)
Basophils Absolute: 0 10*3/uL (ref 0.0–0.1)
Basophils Relative: 1 %
Eosinophils Absolute: 0.1 10*3/uL (ref 0.0–0.5)
Eosinophils Relative: 3 %
HCT: 44.1 % (ref 36.0–46.0)
Hemoglobin: 14 g/dL (ref 12.0–15.0)
Immature Granulocytes: 1 %
Lymphocytes Relative: 50 %
Lymphs Abs: 2 10*3/uL (ref 0.7–4.0)
MCH: 29.8 pg (ref 26.0–34.0)
MCHC: 31.7 g/dL (ref 30.0–36.0)
MCV: 93.8 fL (ref 80.0–100.0)
Monocytes Absolute: 0.2 10*3/uL (ref 0.1–1.0)
Monocytes Relative: 6 %
Neutro Abs: 1.5 10*3/uL — ABNORMAL LOW (ref 1.7–7.7)
Neutrophils Relative %: 39 %
Platelets: 217 10*3/uL (ref 150–400)
RBC: 4.7 MIL/uL (ref 3.87–5.11)
RDW: 13.3 % (ref 11.5–15.5)
WBC: 4 10*3/uL (ref 4.0–10.5)
nRBC: 0 % (ref 0.0–0.2)

## 2019-07-24 LAB — URINALYSIS, ROUTINE W REFLEX MICROSCOPIC
Bilirubin Urine: NEGATIVE
Glucose, UA: NEGATIVE mg/dL
Hgb urine dipstick: NEGATIVE
Ketones, ur: NEGATIVE mg/dL
Leukocytes,Ua: NEGATIVE
Nitrite: NEGATIVE
Protein, ur: NEGATIVE mg/dL
Specific Gravity, Urine: 1.02 (ref 1.005–1.030)
pH: 7 (ref 5.0–8.0)

## 2019-07-24 LAB — COMPREHENSIVE METABOLIC PANEL
ALT: 47 U/L — ABNORMAL HIGH (ref 0–44)
AST: 34 U/L (ref 15–41)
Albumin: 3.9 g/dL (ref 3.5–5.0)
Alkaline Phosphatase: 80 U/L (ref 38–126)
Anion gap: 11 (ref 5–15)
BUN: 15 mg/dL (ref 6–20)
CO2: 22 mmol/L (ref 22–32)
Calcium: 9.1 mg/dL (ref 8.9–10.3)
Chloride: 104 mmol/L (ref 98–111)
Creatinine, Ser: 0.82 mg/dL (ref 0.44–1.00)
GFR calc Af Amer: >60 mL/min (ref >60)
GFR calc non Af Amer: >60 mL/min (ref >60)
Glucose, Bld: 96 mg/dL (ref 70–99)
Potassium: 4.1 mmol/L (ref 3.5–5.1)
Sodium: 137 mmol/L (ref 135–145)
Total Bilirubin: 0.5 mg/dL (ref 0.3–1.2)
Total Protein: 8 g/dL (ref 6.5–8.1)

## 2019-07-24 MED ORDER — METHOCARBAMOL 500 MG PO TABS
500.0000 mg | ORAL_TABLET | Freq: Once | ORAL | Status: AC
  Administered 2019-07-24: 500 mg via ORAL
  Filled 2019-07-24: qty 1

## 2019-07-24 MED ORDER — METHOCARBAMOL 500 MG PO TABS
500.0000 mg | ORAL_TABLET | Freq: Every evening | ORAL | 0 refills | Status: DC | PRN
Start: 2019-07-24 — End: 2021-01-28

## 2019-07-24 MED ORDER — LIDOCAINE 5 % EX PTCH: 1.0000 | MEDICATED_PATCH | CUTANEOUS | 0 refills | Status: DC

## 2019-07-24 MED FILL — METHOCARBAMOL 500 MG TABS: 500 | 10 days supply | Qty: 10 | Fill #0

## 2019-07-24 NOTE — ED Provider Notes (Signed)
[facility] Provider Note   CSN: [id number] Arrival date & time: 07/24/19  1217     History Chief Complaint  Patient presents with  . Shortness of Breath    Carol Conner is a 59 y.o. female presented for evaluation of shortness of breath.  Patient states she developed right mid back pain yesterday.  Today, she felt very short of breath, states her pain was worse if she took a deep breath in.  She also felt lightheaded, but states she had not had anything to eat.  Her light headedness is since resolved.  She does not feel short of breath at rest, only when she takes deep breath in.  She reports her sister died of a PE, that she is very concerned about this.  She denies fevers, chills, chest pain, cough, nausea, vomiting, Donnell pain, urinary symptoms, abnormal bowel movements. She has not taken anything for her sxs. She reports pain feels like a cramp, but denies change in physical activity or injury.   HPI     Past Medical History:  Diagnosis Date  . Heme positive stool   . Sleep apnea    uses c-pap    Patient Active Problem List   Diagnosis Date Noted  . Primary osteoarthritis of left knee 07/11/2019  . Morbid obesity ([place]) 07/11/2019  . Knee pain, left 05/05/2018  . Vaginal yeast infection 06/30/2016  . Skin abscess 03/28/2014  . Hematochezia 03/24/2013  . Acute blood loss anemia 03/24/2013  . Hyperglycemia 03/24/2013  . OBSTRUCTIVE SLEEP APNEA 08/29/2009  . OVARIAN CYST 08/29/2009  . DYSPNEA 08/29/2009  . CHEST PAIN, UNSPECIFIED 03/06/2008  . OTITIS MEDIA, LEFT 10/13/2007  . ANXIETY 03/11/2007  . HEMORRHOIDS, NOS 03/11/2007  . ANAL STENOSIS 03/11/2007  . PROCTITIS 03/11/2007    Past Surgical History:  Procedure Laterality Date  . abd infection     past hysterectomy  . ABDOMINAL HYSTERECTOMY    . APPENDECTOMY       OB History   No obstetric history on file.     Family History  Problem Relation Age of Onset  . Breast  cancer Mother   . Kidney disease Mother   . Cancer Father   . Prostate cancer Father   . Liver cancer Father   . Pulmonary embolism Sister   . Colon cancer Neg Hx     Social History   Tobacco Use  . Smoking status: Never Smoker  . Smokeless tobacco: Never Used  Substance Use Topics  . Alcohol use: No  . Drug use: No    Home Medications Prior to Admission medications   Medication Sig Start Date End Date Taking? Authorizing Provider  Cholecalciferol (VITAMIN D3) 50 MCG (2000 UT) capsule Take 1 capsule (2,000 Units total) by mouth daily. 05/05/18   Plotnikov, Evie Lacks, MD  Diclofenac Sodium (PENNSAID) 2 % SOLN Place 1 application onto the skin 2 (two) times daily. 07/11/19   Gregor Hams, MD  lidocaine (LIDODERM) 5 % Place 1 patch onto the skin daily. Remove & Discard patch within 12 hours or as directed by MD 07/24/19   Hafiz Irion, PA-C  magnesium 30 MG tablet Take 500 mg by mouth at bedtime.     [provider]  meloxicam (MOBIC) 15 MG tablet Take 1 tablet (15 mg total) by mouth daily as needed for pain. Patient not taking: Reported on 07/11/2019 05/05/18   Plotnikov, Evie Lacks, MD  methocarbamol (ROBAXIN) 500 MG tablet Take 1 tablet (  500 mg total) by mouth at bedtime as needed for muscle spasms. 07/24/19   Bernadette Gores, PA-C  nabumetone (RELAFEN) 500 MG tablet Take 1 tablet (500 mg total) by mouth 2 (two) times daily as needed for moderate pain. 06/14/19   Plotnikov, Evie Lacks, MD  OIL OF OREGANO PO Take 2 each by mouth 3 times/day as needed-between meals & bedtime.    [provider]    Allergies    Patient has no known allergies.  Review of Systems   Review of Systems  Respiratory: Positive for shortness of breath.   Musculoskeletal: Positive for back pain.  All other systems reviewed and are negative.   Physical Exam Updated Vital Signs BP 121/80 (BP Location: Right Arm)   Pulse 79   Temp 97.7 F (36.5 C) (Oral)   Resp 18   Ht 5\' 3"  (1.6 m)    Wt 113.4 kg   SpO2 98%   BMI 44.29 kg/m   Physical Exam Vitals and nursing note reviewed.  Constitutional:      General: She is not in acute distress.    Appearance: She is well-developed. She is obese.     Comments: Obese female resting comfortably in the bed in no acute distress  HENT:     Head: Normocephalic and atraumatic.  Eyes:     Conjunctiva/sclera: Conjunctivae normal.     Pupils: Pupils are equal, round, and reactive to light.  Cardiovascular:     Rate and Rhythm: Normal rate and regular rhythm.  Pulmonary:     Effort: Pulmonary effort is normal. No respiratory distress.     Breath sounds: Normal breath sounds. No wheezing.     Comments: Clear lung sounds in all fields. Abdominal:     General: Bowel sounds are normal. There is no distension.     Palpations: Abdomen is soft.     Tenderness: There is no abdominal tenderness.  Musculoskeletal:        General: Normal range of motion.     Cervical back: Normal range of motion and neck supple.       Back:     Right lower leg: No edema.     Left lower leg: No edema.     Comments: Tenderness palpation of right mid back/flank.  No tenderness palpation of the midline spine.  No step-offs or deformities.  Skin:    General: Skin is warm and dry.     Capillary Refill: Capillary refill takes less than 2 seconds.  Neurological:     Mental Status: She is alert and oriented to person, place, and time.     ED Results / Procedures / Treatments   Labs (all labs ordered are listed, but only abnormal results are displayed) Labs Reviewed  CBC WITH DIFFERENTIAL/PLATELET - Abnormal; Notable for the following components:      Result Value   Neutro Abs 1.5 (*)    All other components within normal limits  COMPREHENSIVE METABOLIC PANEL - Abnormal; Notable for the following components:   ALT 47 (*)    All other components within normal limits  URINALYSIS, ROUTINE W REFLEX MICROSCOPIC    EKG None  Radiology DG Chest Portable  1 View  Result Date: 07/24/2019 CLINICAL DATA:  Shortness of breath and right flank pain. EXAM: PORTABLE CHEST 1 VIEW COMPARISON:  None. FINDINGS: There is no evidence of acute infiltrate, pleural effusion or pneumothorax. The heart size and mediastinal contours are within normal limits. The visualized skeletal structures are unremarkable. IMPRESSION:  No active disease. Electronically Signed   By: Virgina Norfolk M.D.   On: 07/24/2019 15:17   CT Renal Stone Study  Result Date: 07/24/2019 CLINICAL DATA:  Right flank pain, shortness of breath EXAM: CT ABDOMEN AND PELVIS WITHOUT CONTRAST TECHNIQUE: Multidetector CT imaging of the abdomen and pelvis was performed following the standard protocol without IV contrast. COMPARISON:  09/10/2009 FINDINGS: Lower chest: No acute abnormality. Hepatobiliary: No solid liver abnormality is seen. Hepatic steatosis no gallstones, gallbladder wall thickening, or biliary dilatation. Pancreas: Unremarkable. No pancreatic ductal dilatation or surrounding inflammatory changes. Spleen: Normal in size without significant abnormality. Adrenals/Urinary Tract: Adrenal glands are unremarkable. Kidneys are normal, without renal calculi, solid lesion, or hydronephrosis. Bladder is unremarkable. Stomach/Bowel: Stomach is within normal limits. Status post appendectomy. No evidence of bowel wall thickening, distention, or inflammatory changes. Vascular/Lymphatic: No significant vascular findings are present. No enlarged abdominal or pelvic lymph nodes. Reproductive: Status post hysterectomy. Other: No abdominal wall hernia or abnormality. No abdominopelvic ascites. Musculoskeletal: No acute or significant osseous findings. IMPRESSION: 1. No non-contrast CT findings of the abdomen or pelvis to explain right flank pain. No evidence of urinary tract calculus or hydronephrosis. 2. Hepatic steatosis. 3. Status post appendectomy and hysterectomy. Electronically Signed   By: Eddie Candle M.D.   On:  07/24/2019 16:45    Procedures Procedures (including critical care time)  Medications Ordered in ED Medications  methocarbamol (ROBAXIN) tablet 500 mg (500 mg Oral Given 07/24/19 1608)    ED Course  I have reviewed the triage vital signs and the nursing notes.  Pertinent labs & imaging results that were available during my care of the patient were reviewed by me and considered in my medical decision making (see chart for details).    MDM Rules/Calculators/A&P                          Patient presented for evaluation of mid right back pain.  On exam, patient appears nontoxic.  Pain is reproducible with palpation of the musculature.  Pain is below the level of the lung, as such, low suspicion for PE.  Labs obtained from triage read interpreted by me, overall reassuring.  No leukocytosis.  Electrolytes stable.  Chest x-ray viewed interpreted by me, no pneumonia, pnx, effusion, cardiomegaly.  Patient's pain is over her right kidney, as such, consider kidney stone.  Less likely [place] in the setting of a normal urine and without fever or vomiting.  Will obtain CT to rule out kidney stone, and treat for muscular pain with Robaxin.  CT renal negative for acute findings.  On reassessment, patient reports improvement of pain with Robaxin.  As such, likely MSK.  Discussed with patient.  Discussed continued symptomatic treatment at home.  At this time, patient appears safe for discharge.  Return precautions given.  Patient states she understands and agrees to plan.  Final Clinical Impression(s) / ED Diagnoses Final diagnoses:  Acute right-sided back pain, unspecified back location    Rx / DC Orders ED Discharge Orders         Ordered    methocarbamol (ROBAXIN) 500 MG tablet  At bedtime PRN     Discontinue  Reprint     07/24/19 1727    lidocaine (LIDODERM) 5 %  Every 24 hours     Discontinue  Reprint     07/24/19 McConnells, Jemuel Laursen, PA-C 07/24/19 2332  Davonna Belling,  MD 07/25/19 [phone]

## 2019-07-24 NOTE — Discharge Instructions (Signed)
Take ibuprofen 3 times a day with meals.  Do not take other anti-inflammatories at the same time (Advil, Motrin, naproxen 2 Aleve). You may supplement with Tylenol if you need further pain control. Use Robaxin as needed for muscle stiffness or soreness. Have caution, as this may make you tired or groggy. Do not drive or operate heavy machinery while taking this medication.  Use lidocaine patch as needed for pain.  Do the stretches listed in the paperwork.  Use heat/ice for pain control.  Follow up with your orthopedic doctor if pain is not improving with this treatment.  Return to the ER if you develop high fevers, numbness, loss of bowel or bladder control, or any new or concerning symptoms.

## 2019-07-24 NOTE — ED Triage Notes (Signed)
Pt states she started having right flank pain last night.  Today, she was in the car and started having sob.  Pt recently traveled on a plane on 04/28/22.  Sister died from PE and she was worried.

## 2019-07-26 ENCOUNTER — Ambulatory Visit: Payer: 59 | Admitting: Rehabilitative and Restorative Service Providers"

## 2019-07-26 ENCOUNTER — Encounter: Payer: Self-pay | Admitting: Rehabilitative and Restorative Service Providers"

## 2019-07-26 ENCOUNTER — Other Ambulatory Visit: Payer: Self-pay

## 2019-07-26 DIAGNOSIS — G8929 Other chronic pain: Secondary | ICD-10-CM

## 2019-07-26 DIAGNOSIS — M6281 Muscle weakness (generalized): Secondary | ICD-10-CM

## 2019-07-26 DIAGNOSIS — R262 Difficulty in walking, not elsewhere classified: Secondary | ICD-10-CM | POA: Diagnosis not present

## 2019-07-26 DIAGNOSIS — M25562 Pain in left knee: Secondary | ICD-10-CM

## 2019-07-26 NOTE — Patient Instructions (Signed)
Access Code: [email or web address] URL: [email or web address] Date: 07/26/2019 Prepared by: Scot Jun  Exercises Sidelying Hip Abduction - 1 x daily - 7 x weekly - 3 sets - 10 reps Hip Flexor Stretch at Edge of Bed - 1 x daily - 7 x weekly - 1 sets - 5 reps - 30 hold Single Leg Stance - 1 x daily - 7 x weekly - 1 sets - 5 reps - 30 hold

## 2019-07-26 NOTE — Therapy (Signed)
[facility] Physical Therapy [address] [place], [place], [address] Phone: [phone]   Fax:  [phone]  Physical Therapy Evaluation  Patient Details  Name: Carol Conner MRN: [id number] Date of Birth: 1960-07-31 Referring Provider (PT): Dr. Lynne Leader   Encounter Date: 07/26/2019   PT End of Session - 07/26/19 0939    Visit Number 1    Number of Visits 12    Date for PT Re-Evaluation 09/06/19    Progress Note Due on Visit 10    PT Start Time 0939    PT Stop Time 1010    PT Time Calculation (min) 31 min    Activity Tolerance Patient tolerated treatment well    Behavior During Therapy [facility] for tasks assessed/performed           Past Medical History:  Diagnosis Date  . Heme positive stool   . Sleep apnea    uses c-pap    Past Surgical History:  Procedure Laterality Date  . abd infection     past hysterectomy  . ABDOMINAL HYSTERECTOMY    . APPENDECTOMY      There were no vitals filed for this visit.    Subjective Assessment - 07/26/19 0942    Subjective Pt. came to clinic c complaints of Lt knee pain, dating back to insidious onset of pain she noticed c prolonged walking on vacation.  Pt. stated popping noticed at times.  Pt. stated symptoms began to become more prominant in daily life.  Pt. stated MD suggested some strengthening.    Pertinent History MD note update: HPI: Pt is a 59 y/o female presenting w/ chronic L knee pain x approximately 5 months when she started walking for exercise.  She locates her pain to her L anterior knee.  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  Took muscle relaxer.    Limitations Walking;Standing    How long can you stand comfortably? <10 mins    How long can you walk comfortably? <10 mins    Patient Stated Goals Reduce pain    Currently in Pain? Yes    Pain Score 10-Worst pain ever   at worst   Pain Location Knee    Pain  Orientation Left    Pain Descriptors / Indicators Aching    Pain Onset More than a month ago    Pain Frequency Intermittent    Aggravating Factors  walking, standing, stairs, squats    Pain Relieving Factors sitting/resting    Effect of Pain on Daily Activities Limited in prolonged walking/standing, exercise              [facility] PT Assessment - 07/26/19 0001      Assessment   Medical Diagnosis Lt knee pain    Referring Provider (PT) Dr. Lynne Leader    Onset Date/Surgical Date 02/20/19    Hand Dominance Right      Precautions   Precautions None      Restrictions   Weight Bearing Restrictions No      Balance Screen   Has the patient fallen in the past 6 months No    Is the patient reluctant to leave their home because of a fear of falling?  No      Home Environment   Living Environment Private residence    Home Layout Two level    Alternate Level Stairs-Number of Steps flight of stairs  Alternate Level Stairs-Rails Left   up stairs     Prior Function   Level of Independence Independent    Vocation Full time employment    Haematologist    Leisure walking exercise, dancing      Cognition   Overall Cognitive Status Within Functional Limits for tasks assessed      Sensation   Light Touch Appears Intact      Functional Tests   Functional tests Single leg stance      Single Leg Stance   Comments Rt SLS 30 seconds, Lt SLS 10 seconds c increased aberrant movement      ROM / Strength   AROM / PROM / Strength Strength;PROM;AROM      AROM   Overall AROM Comments Patellar crepitus noted bilateral, not painful    AROM Assessment Site Shoulder;Knee    Right/Left Knee Left;Right    Right Knee Flexion 130    Left Knee Extension 3   hyperextension   Left Knee Flexion 129   mild concordant tightness anterior knee     PROM   Overall PROM Comments equal bilateral    PROM Assessment Site Knee    Right/Left Knee Left;Right      Strength    Strength Assessment Site Ankle;Knee;Hip    Right/Left Hip Left;Right    Right Hip Flexion 5/5    Right Hip Extension 5/5    Left Hip Extension 5/5    Left Hip ABduction 4/5    Right/Left Knee Left;Right    Right Knee Flexion 5/5    Right Knee Extension 5/5    Left Knee Flexion 5/5    Left Knee Extension 4+/5    Right/Left Ankle Left;Right    Right Ankle Dorsiflexion 5/5    Left Ankle Dorsiflexion 5/5      Palpation   Palpation comment Mild tenderness suprapatellar tendon Lt knee      Special Tests   Other special tests + concordant symptoms c + Thomas test on Lt LE                      Objective measurements completed on examination: See above findings.       [facility] Adult PT Treatment/Exercise - 07/26/19 0001      Self-Care   Self-Care Other Self-Care Comments    Other Self-Care Comments  Self care instructions for pain management strategies with explanation regarding pain production in exercise avoidance.  Continued use of icing as beneficial.       Exercises   Exercises Other Exercises    Other Exercises  HEP education/instruction c performance of : seated SLR 2 x 10 Lt LE, supine thomas stretch 30 sec x 3 Lt LE, sidelying Lt hip abd x 5, SLS 30 sec x 1 bilateral                  PT Education - 07/26/19 1025    Education Details HEP, POC    Person(s) Educated Patient    Methods Explanation;Demonstration;Verbal cues;Handout    Comprehension Verbalized understanding;Returned demonstration               PT Long Term Goals - 07/26/19 1015      PT LONG TERM GOAL #1   Title Patient will demonstrate/report pain at worst less than or equal to 2/10 to facilitate minimal limitation in daily activity secondary to pain symptoms.    Time 6    Period Weeks  Status New    Target Date 09/06/19      PT LONG TERM GOAL #2   Title Patient will demonstrate independent use of home exercise program to facilitate ability to maintain/progress functional  gains from skilled physical therapy services.    Time 6    Status New    Target Date 09/06/19      PT LONG TERM GOAL #3   Title Pt. will demonstrate Lt knee AROM WFL s symptoms equal to Rt to facilitate usual mobility at PLOF.    Time 6    Period Weeks    Status New    Target Date 09/06/19      PT LONG TERM GOAL #4   Title Pt. will demonstrate Lt LE MMT 5/5 throughout to facilitate walking, stairs, squats at PLOF.    Time 6    Period Weeks    Status New    Target Date 09/06/19      PT LONG TERM GOAL #5   Title Pt. will demonstrate bilateral SLS 30 seconds s deviation to facilitate stability in ambulation.    Time 6    Period Weeks    Status New    Target Date 09/06/19                  Plan - 07/26/19 1013    Clinical Impression Statement Patient is a 59 y.o. female who comes to clinic with complaints of Lt knee pain with mobility, strength and movement coordination deficits that impair her ability to perform usual daily and recreational functional activities without increase difficulty/symptoms at this time.  Patient to benefit from skilled PT services to address impairments and limitations to improve to previous level of function without restriction secondary to condition.    Personal Factors and Comorbidities Other   Concurrent low back pain   Examination-Activity Limitations Squat;Stairs;Stand;Locomotion Level    Examination-Participation Restrictions Community Activity;Other   work   Stability/Clinical Decision Making Stable/Uncomplicated    Clinical Decision Making Low    Rehab Potential Good    PT Frequency --   1-2x/week   PT Duration 6 weeks    PT Treatment/Interventions ADLs/Self Care Home Management;Electrical Stimulation;Iontophoresis 4mg /ml Dexamethasone;Moist Heat;Balance training;Therapeutic exercise;Therapeutic activities;Functional mobility training;Stair training;Gait training;Ultrasound;Neuromuscular re-education;Patient/family education;Manual  techniques;Vasopneumatic Device;Taping;Dry needling;Passive range of motion;Spinal Manipulations;Joint Manipulations    PT Next Visit Plan Reassess HEP, progress WB strengthening and movement coordination for LE.    PT Home Exercise Plan [email or web address]    Consulted and Agree with Plan of Care Patient           Patient will benefit from skilled therapeutic intervention in order to improve the following deficits and impairments:  Decreased endurance, Hypomobility, Obesity, Decreased activity tolerance, Decreased strength, Pain, Difficulty walking, Decreased mobility, Decreased balance, Decreased range of motion, Impaired perceived functional ability, Impaired flexibility, Decreased coordination  Visit Diagnosis: Chronic pain of left knee  Muscle weakness (generalized)  Difficulty in walking, not elsewhere classified     Problem List Patient Active Problem List   Diagnosis Date Noted  . Primary osteoarthritis of left knee 07/11/2019  . Morbid obesity ([place]) 07/11/2019  . Knee pain, left 05/05/2018  . Vaginal yeast infection 06/30/2016  . Skin abscess 03/28/2014  . Hematochezia 03/24/2013  . Acute blood loss anemia 03/24/2013  . Hyperglycemia 03/24/2013  . OBSTRUCTIVE SLEEP APNEA 08/29/2009  . OVARIAN CYST 08/29/2009  . DYSPNEA 08/29/2009  . CHEST PAIN, UNSPECIFIED 03/06/2008  . OTITIS MEDIA, LEFT 10/13/2007  . ANXIETY 03/11/2007  .  HEMORRHOIDS, NOS 03/11/2007  . ANAL STENOSIS 03/11/2007  . PROCTITIS 03/11/2007    Scot Jun, PT, DPT, OCS, ATC 07/26/19  10:26 AM    [facility] Physical Therapy [address] [place], [place], [address] Phone: [phone]   Fax:  [phone]  Name: Aryahna B Bartnik MRN: [id number] Date of Birth: 06-08-1960

## 2019-08-01 ENCOUNTER — Telehealth: Payer: Self-pay | Admitting: Rehabilitative and Restorative Service Providers"

## 2019-08-01 ENCOUNTER — Encounter: Payer: 59 | Admitting: Rehabilitative and Restorative Service Providers"

## 2019-08-01 NOTE — Telephone Encounter (Signed)
No show for today's appointment.  Called with reminder for next appointment time, requested call back for confirmation.   Scot Jun, PT, DPT, OCS, ATC 08/01/19  12:02 PM

## 2019-08-15 ENCOUNTER — Other Ambulatory Visit: Payer: Self-pay

## 2019-08-15 ENCOUNTER — Ambulatory Visit (INDEPENDENT_AMBULATORY_CARE_PROVIDER_SITE_OTHER): Payer: 59 | Admitting: Physical Therapy

## 2019-08-15 DIAGNOSIS — M25562 Pain in left knee: Secondary | ICD-10-CM | POA: Diagnosis not present

## 2019-08-15 DIAGNOSIS — R262 Difficulty in walking, not elsewhere classified: Secondary | ICD-10-CM

## 2019-08-15 DIAGNOSIS — M6281 Muscle weakness (generalized): Secondary | ICD-10-CM | POA: Diagnosis not present

## 2019-08-15 DIAGNOSIS — G8929 Other chronic pain: Secondary | ICD-10-CM

## 2019-08-15 NOTE — Therapy (Signed)
[facility] Physical Therapy [address] [place], [place], [address] Phone: [phone]   Fax:  [phone]  Physical Therapy Treatment  Patient Details  Name: Carol Conner MRN: [id number] Date of Birth: 20-Sep-1960 Referring Provider (PT): Dr. Lynne Leader   Encounter Date: 08/15/2019   PT End of Session - 08/15/19 0950    Visit Number 2    Number of Visits 12    Date for PT Re-Evaluation 09/06/19    Progress Note Due on Visit 10    PT Start Time 0935    PT Stop Time 1013    PT Time Calculation (min) 38 min    Activity Tolerance Patient tolerated treatment well    Behavior During Therapy [facility] for tasks assessed/performed           Past Medical History:  Diagnosis Date  . Heme positive stool   . Sleep apnea    uses c-pap    Past Surgical History:  Procedure Laterality Date  . abd infection     past hysterectomy  . ABDOMINAL HYSTERECTOMY    . APPENDECTOMY      There were no vitals filed for this visit.   Subjective Assessment - 08/15/19 0947    Subjective Relays her Lt knee is actually doing much better, denies pain upon arrival but still gets twinges from time to time or still gets pain if she stands too long, states that she was able to wear heels without pain or difficulty.    Pertinent History MD note update: HPI: Pt is a 59 y/o female presenting w/ chronic L knee pain x approximately 5 months when she started walking for exercise.  She locates her pain to her L anterior knee.  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  Took muscle relaxer.    Limitations Walking;Standing    How long can you stand comfortably? <10 mins    How long can you walk comfortably? <10 mins    Patient Stated Goals Reduce pain    Pain Onset More than a month ago             [facility] Adult PT Treatment/Exercise - 08/15/19 0001      Exercises   Exercises Knee/Hip      Knee/Hip  Exercises: Stretches   Hip Flexor Stretch Left;5 reps;20 seconds    Hip Flexor Stretch Limitations supine with leg off EOB       Knee/Hip Exercises: Aerobic   Nustep L6 X 6.5 minutes      Knee/Hip Exercises: Machines for Strengthening   Cybex Knee Extension 10 lbs bilat push 2X10 then 5 lbs Lt only 2X10, used hip add ball squeeze which helped reduce popping in her knee    Cybex Knee Flexion bilat pull at 20 lbs, then Lt leg only 2 sets of 10     Cybex Leg Press bilat push 75 lbs 2X10, then Lt leg only 2X10 at 75 lbs      Knee/Hip Exercises: Supine   Bridges with Clamshell 2 sets;10 reps   Green     Knee/Hip Exercises: Sidelying   Hip ABduction Left;2 sets;10 reps             PT Long Term Goals - 07/26/19 1015      PT LONG TERM GOAL #1   Title Patient will demonstrate/report pain at worst less than or equal to 2/10 to facilitate  minimal limitation in daily activity secondary to pain symptoms.    Time 6    Period Weeks    Status New    Target Date 09/06/19      PT LONG TERM GOAL #2   Title Patient will demonstrate independent use of home exercise program to facilitate ability to maintain/progress functional gains from skilled physical therapy services.    Time 6    Status New    Target Date 09/06/19      PT LONG TERM GOAL #3   Title Pt. will demonstrate Lt knee AROM WFL s symptoms equal to Rt to facilitate usual mobility at PLOF.    Time 6    Period Weeks    Status New    Target Date 09/06/19      PT LONG TERM GOAL #4   Title Pt. will demonstrate Lt LE MMT 5/5 throughout to facilitate walking, stairs, squats at PLOF.    Time 6    Period Weeks    Status New    Target Date 09/06/19      PT LONG TERM GOAL #5   Title Pt. will demonstrate bilateral SLS 30 seconds s deviation to facilitate stability in ambulation.    Time 6    Period Weeks    Status New    Target Date 09/06/19                 Plan - 08/15/19 1004    Clinical Impression Statement She was  not having a lot of pain today so was able to begin resistance equipment for overall knee strengthening with good overall tolerance. She did have some popping in her knee with knee extension machine but when added hip add ball squeeze this went away. Continue POC    Personal Factors and Comorbidities Other   Concurrent low back pain   Examination-Activity Limitations Squat;Stairs;Stand;Locomotion Level    Examination-Participation Restrictions Community Activity;Other   work   Stability/Clinical Decision Making Stable/Uncomplicated    Rehab Potential Good    PT Frequency --   1-2x/week   PT Duration 6 weeks    PT Treatment/Interventions ADLs/Self Care Home Management;Electrical Stimulation;Iontophoresis 4mg /ml Dexamethasone;Moist Heat;Balance training;Therapeutic exercise;Therapeutic activities;Functional mobility training;Stair training;Gait training;Ultrasound;Neuromuscular re-education;Patient/family education;Manual techniques;Vasopneumatic Device;Taping;Dry needling;Passive range of motion;Spinal Manipulations;Joint Manipulations    PT Next Visit Plan how was she feeling after last session? progress WB strengthening and movement coordination for LE.    PT Home Exercise Plan CEBADMEP    Consulted and Agree with Plan of Care Patient           Patient will benefit from skilled therapeutic intervention in order to improve the following deficits and impairments:  Decreased endurance, Hypomobility, Obesity, Decreased activity tolerance, Decreased strength, Pain, Difficulty walking, Decreased mobility, Decreased balance, Decreased range of motion, Impaired perceived functional ability, Impaired flexibility, Decreased coordination  Visit Diagnosis: Chronic pain of left knee  Muscle weakness (generalized)  Difficulty in walking, not elsewhere classified     Problem List Patient Active Problem List   Diagnosis Date Noted  . Primary osteoarthritis of left knee 07/11/2019  . Morbid obesity  ([place]) 07/11/2019  . Knee pain, left 05/05/2018  . Vaginal yeast infection 06/30/2016  . Skin abscess 03/28/2014  . Hematochezia 03/24/2013  . Acute blood loss anemia 03/24/2013  . Hyperglycemia 03/24/2013  . OBSTRUCTIVE SLEEP APNEA 08/29/2009  . OVARIAN CYST 08/29/2009  . DYSPNEA 08/29/2009  . CHEST PAIN, UNSPECIFIED 03/06/2008  . OTITIS MEDIA, LEFT 10/13/2007  . ANXIETY 03/11/2007  .  HEMORRHOIDS, NOS 03/11/2007  . ANAL STENOSIS 03/11/2007  . PROCTITIS 03/11/2007    Debbe Odea, PT,DPT 08/15/2019, 10:11 AM  [facility] Physical Therapy [address] [place], [place], [address] Phone: [phone]   Fax:  [phone]  Name: Carol Conner MRN: [id number] Date of Birth: March 21, 1960

## 2019-08-17 ENCOUNTER — Ambulatory Visit (INDEPENDENT_AMBULATORY_CARE_PROVIDER_SITE_OTHER): Payer: 59 | Admitting: Physical Therapy

## 2019-08-17 ENCOUNTER — Other Ambulatory Visit: Payer: Self-pay

## 2019-08-17 DIAGNOSIS — G8929 Other chronic pain: Secondary | ICD-10-CM | POA: Diagnosis not present

## 2019-08-17 DIAGNOSIS — M25562 Pain in left knee: Secondary | ICD-10-CM

## 2019-08-17 DIAGNOSIS — M6281 Muscle weakness (generalized): Secondary | ICD-10-CM | POA: Diagnosis not present

## 2019-08-17 DIAGNOSIS — R262 Difficulty in walking, not elsewhere classified: Secondary | ICD-10-CM | POA: Diagnosis not present

## 2019-08-17 NOTE — Therapy (Signed)
[facility] Physical Therapy [address] [place], [place], [address] Phone: [phone]   Fax:  [phone]  Physical Therapy Treatment  Patient Details  Name: Carol Conner MRN: [id number] Date of Birth: [birth year] Referring Provider (PT): Dr. Lynne Leader   Encounter Date: 08/17/2019   PT End of Session - 08/17/19 1037    Visit Number 3    Number of Visits 12    Date for PT Re-Evaluation 09/06/19    Progress Note Due on Visit 10    PT Start Time 1032   she arrives 16 min late   PT Stop Time 1100    PT Time Calculation (min) 28 min    Activity Tolerance Patient tolerated treatment well    Behavior During Therapy [facility] for tasks assessed/performed           Past Medical History:  Diagnosis Date  . Heme positive stool   . Sleep apnea    uses c-pap    Past Surgical History:  Procedure Laterality Date  . abd infection     past hysterectomy  . ABDOMINAL HYSTERECTOMY    . APPENDECTOMY      There were no vitals filed for this visit.   Subjective Assessment - 08/17/19 1034    Subjective Relays her Lt knee is doing farily well today, "I wouldn't call it pain, but I am aware its there"    Pertinent History MD note update: HPI: Pt is a 59 y/o female presenting w/ chronic L knee pain x approximately 5 months when she started walking for exercise.  She locates her pain to her L anterior knee.  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  Took muscle relaxer.    Limitations Walking;Standing    How long can you stand comfortably? <10 mins    How long can you walk comfortably? <10 mins    Patient Stated Goals Reduce pain    Pain Onset More than a month ago             [facility] Adult PT Treatment/Exercise - 08/17/19 0001      Knee/Hip Exercises: Stretches   Hip Flexor Stretch Left;5 reps;20 seconds    Hip Flexor Stretch Limitations supine with leg off EOB       Knee/Hip  Exercises: Aerobic   Nustep L6 X 5 minutes      Knee/Hip Exercises: Machines for Strengthening   Cybex Knee Extension 10 lbs bilat push 2X10 then 5 lbs Lt only 2X10, used hip add ball squeeze which helped reduce popping in her knee    Cybex Knee Flexion bilat pull at 25 lbs, then Lt leg only 15 lbs 2 sets of 10     Cybex Leg Press bilat push 100 lbs 2X10, then Lt leg only 2X10 at 75 lbs      Knee/Hip Exercises: Seated   Sit to Sand 2 sets;10 reps;without UE support   and ball sq from slightly raised mat surface     Knee/Hip Exercises: Supine   Bridges with Clamshell 2 sets;10 reps   blue   Straight Leg Raises Left;2 sets;10 reps      Knee/Hip Exercises: Sidelying   Hip ABduction Left;2 sets;10 reps                       PT Long Term Goals - 07/26/19 1015  PT LONG TERM GOAL #1   Title Patient will demonstrate/report pain at worst less than or equal to 2/10 to facilitate minimal limitation in daily activity secondary to pain symptoms.    Time 6    Period Weeks    Status New    Target Date 09/06/19      PT LONG TERM GOAL #2   Title Patient will demonstrate independent use of home exercise program to facilitate ability to maintain/progress functional gains from skilled physical therapy services.    Time 6    Status New    Target Date 09/06/19      PT LONG TERM GOAL #3   Title Pt. will demonstrate Lt knee AROM WFL s symptoms equal to Rt to facilitate usual mobility at PLOF.    Time 6    Period Weeks    Status New    Target Date 09/06/19      PT LONG TERM GOAL #4   Title Pt. will demonstrate Lt LE MMT 5/5 throughout to facilitate walking, stairs, squats at PLOF.    Time 6    Period Weeks    Status New    Target Date 09/06/19      PT LONG TERM GOAL #5   Title Pt. will demonstrate bilateral SLS 30 seconds s deviation to facilitate stability in ambulation.    Time 6    Period Weeks    Status New    Target Date 09/06/19                 Plan -  08/17/19 1052    Clinical Impression Statement Again not having much pain so progressed her strength training with good tolerance and without complaints. Overall appears to be making good progress with PT.    Personal Factors and Comorbidities Other   Concurrent low back pain   Examination-Activity Limitations Squat;Stairs;Stand;Locomotion Level    Examination-Participation Restrictions Community Activity;Other   work   Stability/Clinical Decision Making Stable/Uncomplicated    Rehab Potential Good    PT Frequency --   1-2x/week   PT Duration 6 weeks    PT Treatment/Interventions ADLs/Self Care Home Management;Electrical Stimulation;Iontophoresis 4mg /ml Dexamethasone;Moist Heat;Balance training;Therapeutic exercise;Therapeutic activities;Functional mobility training;Stair training;Gait training;Ultrasound;Neuromuscular re-education;Patient/family education;Manual techniques;Vasopneumatic Device;Taping;Dry needling;Passive range of motion;Spinal Manipulations;Joint Manipulations    PT Next Visit Plan how was she feeling after last session? progress WB strengthening and movement coordination for LE.    PT Home Exercise Plan CEBADMEP    Consulted and Agree with Plan of Care Patient           Patient will benefit from skilled therapeutic intervention in order to improve the following deficits and impairments:  Decreased endurance, Hypomobility, Obesity, Decreased activity tolerance, Decreased strength, Pain, Difficulty walking, Decreased mobility, Decreased balance, Decreased range of motion, Impaired perceived functional ability, Impaired flexibility, Decreased coordination  Visit Diagnosis: Chronic pain of left knee  Muscle weakness (generalized)  Difficulty in walking, not elsewhere classified     Problem List Patient Active Problem List   Diagnosis Date Noted  . Primary osteoarthritis of left knee 07/11/2019  . Morbid obesity ([place]) 07/11/2019  . Knee pain, left 05/05/2018  .  Vaginal yeast infection 06/30/2016  . Skin abscess 03/28/2014  . Hematochezia 03/24/2013  . Acute blood loss anemia 03/24/2013  . Hyperglycemia 03/24/2013  . OBSTRUCTIVE SLEEP APNEA 08/29/2009  . OVARIAN CYST 08/29/2009  . DYSPNEA 08/29/2009  . CHEST PAIN, UNSPECIFIED 03/06/2008  . OTITIS MEDIA, LEFT 10/13/2007  . ANXIETY 03/11/2007  .  HEMORRHOIDS, NOS 03/11/2007  . ANAL STENOSIS 03/11/2007  . PROCTITIS 03/11/2007    Silvestre Mesi 08/17/2019, 11:48 AM  [facility] Physical Therapy [address] [place], [place], [address] Phone: [phone]   Fax:  [phone]  Name: Carol Conner MRN: [id number] Date of Birth: 1960-09-16

## 2019-08-22 ENCOUNTER — Encounter: Payer: 59 | Admitting: Physical Therapy

## 2019-08-22 ENCOUNTER — Ambulatory Visit (INDEPENDENT_AMBULATORY_CARE_PROVIDER_SITE_OTHER): Payer: 59 | Admitting: Family Medicine

## 2019-08-22 DIAGNOSIS — Z5329 Procedure and treatment not carried out because of patient's decision for other reasons: Secondary | ICD-10-CM

## 2019-08-22 NOTE — Progress Notes (Signed)
No show

## 2019-08-24 ENCOUNTER — Encounter: Payer: 59 | Admitting: Rehabilitative and Restorative Service Providers"

## 2019-08-29 ENCOUNTER — Encounter: Payer: Self-pay | Admitting: Rehabilitative and Restorative Service Providers"

## 2019-08-29 ENCOUNTER — Other Ambulatory Visit: Payer: Self-pay

## 2019-08-29 ENCOUNTER — Ambulatory Visit (INDEPENDENT_AMBULATORY_CARE_PROVIDER_SITE_OTHER): Payer: 59 | Admitting: Rehabilitative and Restorative Service Providers"

## 2019-08-29 DIAGNOSIS — G8929 Other chronic pain: Secondary | ICD-10-CM

## 2019-08-29 DIAGNOSIS — M25562 Pain in left knee: Secondary | ICD-10-CM

## 2019-08-29 DIAGNOSIS — R262 Difficulty in walking, not elsewhere classified: Secondary | ICD-10-CM

## 2019-08-29 DIAGNOSIS — M6281 Muscle weakness (generalized): Secondary | ICD-10-CM | POA: Diagnosis not present

## 2019-08-29 NOTE — Therapy (Addendum)
[facility] [address] [place], [place], [address] Phone: [phone]   Fax:  [phone]  Physical Therapy Treatment/Discharge  Patient Details  Name: Carol Conner MRN: [id number] Date of Birth: 11/26/1960 Referring Provider (PT): Dr. Clementeen Graham   Encounter Date: 08/29/2019   PT End of Session - 08/29/19 0940    Visit Number 4    Number of Visits 12    Date for PT Re-Evaluation 09/06/19    Progress Note Due on Visit 10    PT Start Time 0934    PT Stop Time 1013    PT Time Calculation (min) 39 min    Activity Tolerance Patient tolerated treatment well    Behavior During Therapy [facility] for tasks assessed/performed           Past Medical History:  Diagnosis Date  . Heme positive stool   . Sleep apnea    uses c-pap    Past Surgical History:  Procedure Laterality Date  . abd infection     past hysterectomy  . ABDOMINAL HYSTERECTOMY    . APPENDECTOMY      There were no vitals filed for this visit.   Subjective Assessment - 08/29/19 0939    Subjective Pt. indicated feeling improvements.  Some occasional complaints but doing better than it was.    Pertinent History MD note update: HPI: Pt is a 59 y/o female presenting w/ chronic L knee pain x approximately 5 months when she started walking for exercise.  She locates her pain to her L anterior knee.  She works as a Scientist, research (physical sciences).  Pt. had recent ED visit due to Rt low back complaints and was screened for abdominal issues.  Took muscle relaxer.    Limitations Walking;Standing    Patient Stated Goals Reduce pain    Currently in Pain? No/denies    Pain Score 0-No pain    Pain Location Knee    Pain Orientation Left    Pain Onset More than a month ago              [facility] PT Assessment - 08/29/19 0001      Assessment   Medical Diagnosis Lt knee pain    Referring Provider (PT) Dr. Clementeen Graham    Onset Date/Surgical Date 02/20/19    Hand Dominance Right                          [facility] Adult PT Treatment/Exercise - 08/29/19 0001      Knee/Hip Exercises: Stretches   Gastroc Stretch 30 seconds;3 reps   incline board     Knee/Hip Exercises: Aerobic   Nustep Lvl 6 10 mins      Knee/Hip Exercises: Standing   Lateral Step Up Step Height: 4";20 reps;Left;Right    Other Standing Knee Exercises squat over chair 2 x 10      Knee/Hip Exercises: Seated   Other Seated Knee/Hip Exercises slr 3 x 10 bilateral                       PT Long Term Goals - 08/29/19 0941      PT LONG TERM GOAL #1   Title Patient will demonstrate/report pain at worst less than or equal to 2/10 to facilitate minimal limitation in daily activity secondary to pain symptoms.    Time 6    Period Weeks    Status On-going    Target Date 09/06/19  PT LONG TERM GOAL #2   Title Patient will demonstrate independent use of home exercise program to facilitate ability to maintain/progress functional gains from skilled physical therapy services.    Time 6    Status On-going    Target Date 09/06/19      PT LONG TERM GOAL #3   Title Pt. will demonstrate Lt knee AROM WFL s symptoms equal to Rt to facilitate usual mobility at PLOF.    Time 6    Period Weeks    Status On-going    Target Date 09/06/19      PT LONG TERM GOAL #4   Title Pt. will demonstrate Lt LE MMT 5/5 throughout to facilitate walking, stairs, squats at PLOF.    Time 6    Period Weeks    Status On-going    Target Date 09/06/19      PT LONG TERM GOAL #5   Title Pt. will demonstrate bilateral SLS 30 seconds s deviation to facilitate stability in ambulation.    Time 6    Period Weeks    Status On-going    Target Date 09/06/19                 Plan - 08/29/19 0959    Clinical Impression Statement Focus today on progression of WB strengthening intervention c good response.  Noted improvement in squat control c reduced symptoms.  Pt .may continue to progress in skilled PT c HEP.     Personal Factors and Comorbidities Other   Concurrent low back pain   Examination-Activity Limitations Squat;Stairs;Stand;Locomotion Level    Examination-Participation Restrictions Community Activity;Other   work   Stability/Clinical Decision Making Stable/Uncomplicated    Rehab Potential Good    PT Frequency --   1-2x/week   PT Duration 6 weeks    PT Treatment/Interventions ADLs/Self Care Home Management;Electrical Stimulation;Iontophoresis '4mg'$ /ml Dexamethasone;Moist Heat;Balance training;Therapeutic exercise;Therapeutic activities;Functional mobility training;Stair training;Gait training;Ultrasound;Neuromuscular re-education;Patient/family education;Manual techniques;Vasopneumatic Device;Taping;Dry needling;Passive range of motion;Spinal Manipulations;Joint Manipulations    PT Next Visit Plan WB strengthening, eccentric step control, HEP transitioning as appropriate.    PT Home Exercise Plan [email or web address]    Consulted and Agree with Plan of Care Patient           Patient will benefit from skilled therapeutic intervention in order to improve the following deficits and impairments:  Decreased endurance, Hypomobility, Obesity, Decreased activity tolerance, Decreased strength, Pain, Difficulty walking, Decreased mobility, Decreased balance, Decreased range of motion, Impaired perceived functional ability, Impaired flexibility, Decreased coordination  Visit Diagnosis: Chronic pain of left knee  Muscle weakness (generalized)  Difficulty in walking, not elsewhere classified     Problem List Patient Active Problem List   Diagnosis Date Noted  . Primary osteoarthritis of left knee 07/11/2019  . Morbid obesity ([place]) 07/11/2019  . Knee pain, left 05/05/2018  . Vaginal yeast infection 06/30/2016  . Skin abscess 03/28/2014  . Hematochezia 03/24/2013  . Acute blood loss anemia 03/24/2013  . Hyperglycemia 03/24/2013  . OBSTRUCTIVE SLEEP APNEA 08/29/2009  . OVARIAN CYST 08/29/2009  . DYSPNEA  08/29/2009  . CHEST PAIN, UNSPECIFIED 03/06/2008  . OTITIS MEDIA, LEFT 10/13/2007  . ANXIETY 03/11/2007  . HEMORRHOIDS, NOS 03/11/2007  . ANAL STENOSIS 03/11/2007  . PROCTITIS 03/11/2007   Scot Jun, PT, DPT, OCS, ATC 08/29/19  10:09 AM  PHYSICAL THERAPY DISCHARGE SUMMARY  Visits from Start of Care: 4  Current functional level related to goals / functional outcomes: See note   Remaining deficits: See note   Education /  Equipment: HEP Plan: Patient agrees to discharge.  Patient goals were partially met. Patient is being discharged due to being pleased with the current functional level.  ?????  Scot Jun, PT, DPT, OCS, ATC 09/05/19  9:07 AM        [facility] [address] [place], [place], [address] Phone: [phone]   Fax:  [phone]  Name: Carol Conner MRN: [id number] Date of Birth: 1960/10/03

## 2019-08-30 ENCOUNTER — Encounter: Payer: Self-pay | Admitting: Family Medicine

## 2019-08-30 ENCOUNTER — Ambulatory Visit: Payer: Self-pay

## 2019-08-30 ENCOUNTER — Ambulatory Visit (INDEPENDENT_AMBULATORY_CARE_PROVIDER_SITE_OTHER): Payer: 59 | Admitting: Family Medicine

## 2019-08-30 VITALS — BP 110/78 | HR 75 | Ht 63.0 in | Wt 253.2 lb

## 2019-08-30 DIAGNOSIS — M25562 Pain in left knee: Secondary | ICD-10-CM | POA: Diagnosis not present

## 2019-08-30 NOTE — Progress Notes (Signed)
   I, Wendy Poet, LAT, ATC, am serving as scribe for Dr. Lynne Leader.  Carol Conner is a 59 y.o. female who presents to [place] at [facility] today for f/u of L anterior knee pain.  She was last seen by Dr. Georgina Snell on 07/11/19 and was referred to PT of which she has completed 4 visits.  She was also advised to use Voltaren gel.  Since her last visit, pt reports that her L knee pain is better.  She rates her improvement at 45%.  She states that she has the same symptoms but can walk farther before they start and typically are less severe.  Cushioned shoes are helping.  She has one more week of PT.  Diagnostic testing: L knee XR- 06/14/19   Pertinent review of systems: No fevers or chills  Relevant historical information: Sleep apnea   Exam:  BP 110/78 (BP Location: Right Arm, Patient Position: Sitting, Cuff Size: Large)   Pulse 75   Ht 5\' 3"  (1.6 m)   Wt 253 lb 3.2 oz (114.9 kg)   SpO2 99%   BMI 44.85 kg/m  General: Well Developed, well nourished, and in no acute distress.   MSK: Left knee normal-appearing normal motion with crepitation.  Stable ligamentous exam.     Assessment and Plan: 59 y.o. female with left knee pain moderately improved with only 4 sessions of physical therapy.  We will extend PT as she is continuing to improve.  Continue Pennsaid/Voltaren gel.  Check back as needed.  Could consider steroid hyaluronic acid injections in the future if needed.   Recheck PRN.   Orders Placed This Encounter  Procedures  . [place] LIMITED JOINT SPACE STRUCTURES LOW LEFT(NO LINKED CHARGES)    Order Specific Question:   Reason for Exam (SYMPTOM  OR DIAGNOSIS REQUIRED)    Answer:   L knee pain    Order Specific Question:   Preferred imaging location?    Answer:   [place]  . Ambulatory referral to Physical Therapy    Referral Priority:   Routine    Referral Type:   Physical Medicine    Referral Reason:   Specialty Services Required     Requested Specialty:   Physical Therapy   No orders of the defined types were placed in this encounter.    Discussed warning signs or symptoms. Please see discharge instructions. Patient expresses understanding.   The above documentation has been reviewed and is accurate and complete Lynne Leader, M.D.

## 2019-08-30 NOTE — Patient Instructions (Signed)
Thank you for coming in today.  Plan to continue PT and topical medicine.  Recheck with me as needed.   I am happy to extend PT if you and PT think it will be helpful.  Let me know.

## 2019-08-31 ENCOUNTER — Encounter: Payer: 59 | Admitting: Rehabilitative and Restorative Service Providers"

## 2019-09-05 ENCOUNTER — Encounter: Payer: 59 | Admitting: Rehabilitative and Restorative Service Providers"

## 2019-09-07 ENCOUNTER — Encounter: Payer: 59 | Admitting: Physical Therapy

## 2020-01-16 ENCOUNTER — Encounter: Payer: 59 | Admitting: Internal Medicine

## 2020-01-16 DIAGNOSIS — Z0289 Encounter for other administrative examinations: Secondary | ICD-10-CM

## 2020-01-24 ENCOUNTER — Encounter: Payer: 59 | Admitting: Internal Medicine

## 2020-08-08 ENCOUNTER — Telehealth: Payer: Self-pay | Admitting: Internal Medicine

## 2020-08-08 ENCOUNTER — Telehealth (INDEPENDENT_AMBULATORY_CARE_PROVIDER_SITE_OTHER): Payer: 59 | Admitting: Family Medicine

## 2020-08-08 DIAGNOSIS — U071 COVID-19: Secondary | ICD-10-CM | POA: Diagnosis not present

## 2020-08-08 MED ORDER — BENZONATATE 100 MG PO CAPS: 100.0000 mg | ORAL_CAPSULE | Freq: Three times a day (TID) | ORAL | 0 refills | Status: DC | PRN

## 2020-08-08 MED ORDER — MOLNUPIRAVIR EUA 200MG CAPSULE: 4.0000 | ORAL_CAPSULE | Freq: Two times a day (BID) | ORAL | 0 refills | Status: AC

## 2020-08-08 NOTE — Progress Notes (Signed)
Virtual Visit via Video Note  I connected with Carol Conner  on 08/08/20 at  3:20 PM EDT by a video enabled telemedicine application and verified that I am speaking with the correct person using two identifiers.  Location patient: home, [place] Location provider:work or home office Persons participating in the virtual visit: patient, provider  I discussed the limitations of evaluation and management by telemedicine and the availability of in person appointments. The patient expressed understanding and agreed to proceed.   HPI:  Acute telemedicine visit for Covid19: -Onset: yesterday; covid test at home was positive -Symptoms include:cough, congestion, sore throat, chills, body aches -Denies: CP, SOB, NVD, inability to eat/drink/get out of bed -Pertinent past medical history: obesity, no regular exercise -Pertinent medication allergies: No Known Allergies -COVID-19 vaccine status: vaccinated x2 and 1 booster  ROS: See pertinent positives and negatives per HPI.  Past Medical History:  Diagnosis Date   Heme positive stool    Sleep apnea    uses c-pap    Past Surgical History:  Procedure Laterality Date   abd infection     past hysterectomy   ABDOMINAL HYSTERECTOMY     APPENDECTOMY       Current Outpatient Medications:    benzonatate (TESSALON PERLES) 100 MG capsule, Take 1 capsule (100 mg total) by mouth 3 (three) times daily as needed., Disp: 20 capsule, Rfl: 0   molnupiravir EUA 200 mg CAPS, Take 4 capsules (800 mg total) by mouth 2 (two) times daily for 5 days., Disp: 40 capsule, Rfl: 0   Cholecalciferol (VITAMIN D3) 50 MCG (2000 UT) capsule, Take 1 capsule (2,000 Units total) by mouth daily., Disp: 100 capsule, Rfl: 3   Diclofenac Sodium (PENNSAID) 2 % SOLN, Place 1 application onto the skin 2 (two) times daily., Disp: 112 g, Rfl: 2   lidocaine (LIDODERM) 5 %, Place 1 patch onto the skin daily. Remove & Discard patch within 12 hours or as directed by MD, Disp: 30 patch, Rfl: 0    magnesium 30 MG tablet, Take 500 mg by mouth at bedtime. , Disp: , Rfl:    meloxicam (MOBIC) 15 MG tablet, Take 1 tablet (15 mg total) by mouth daily as needed for pain. (Patient not taking: Reported on 07/11/2019), Disp: 30 tablet, Rfl: 0   methocarbamol (ROBAXIN) 500 MG tablet, Take 1 tablet (500 mg total) by mouth at bedtime as needed for muscle spasms., Disp: 10 tablet, Rfl: 0   nabumetone (RELAFEN) 500 MG tablet, Take 1 tablet (500 mg total) by mouth 2 (two) times daily as needed for moderate pain., Disp: 60 tablet, Rfl: 1   OIL OF [place] PO, Take 2 each by mouth 3 times/day as needed-between meals & bedtime., Disp: , Rfl:   EXAM:  VITALS per patient if applicable:  GENERAL: alert, oriented, appears well and in no acute distress  HEENT: atraumatic, conjunttiva clear, no obvious abnormalities on inspection of external nose and ears  NECK: normal movements of the head and neck  LUNGS: on inspection no signs of respiratory distress, breathing rate appears normal, no obvious gross SOB, gasping or wheezing  CV: no obvious cyanosis  MS: moves all visible extremities without noticeable abnormality  PSYCH/NEURO: pleasant and cooperative, no obvious depression or anxiety, speech and thought processing grossly intact  ASSESSMENT AND PLAN:  Discussed the following assessment and plan:  COVID-19   Discussed treatment options, ideal treatment window, potential complications, isolation and precautions for COVID-19.  After lengthy discussion, the patient opted for treatment with molnupiravir due  to being higher risk for complications of covid or severe disease and other factors. Discussed EUA status of this drug and the fact that there is preliminary limited knowledge of risks/interactions/side effects per EUA document vs possible benefits and precautions. This information was shared with patient during the visit and also was provided in patient instructions. Also, advised that patient discuss  risks/interactions and use with pharmacist/treatment team as well. The patient did want a prescription for cough, Tessalon Rx sent.  Other symptomatic care measures summarized in patient instructions. Work/School slipped offered:  declined Follow up: Advised to seek prompt in person care if worsening, new symptoms arise, or if is not improving with treatment. Discussed options for inperson care if PCP office not available. Did let this patient know that I only do telemedicine on Tuesdays and Thursdays for [place]. Advised to schedule follow up visit with PCP or [facility] if any further questions or concerns to avoid delays in care.   I discussed the assessment and treatment plan with the patient. The patient was provided an opportunity to ask questions and all were answered. The patient agreed with the plan and demonstrated an understanding of the instructions.     Lucretia Kern, DO

## 2020-08-08 NOTE — Patient Instructions (Signed)
HOME CARE TIPS:  -I sent the medication(s) we discussed to your pharmacy: Meds ordered this encounter  Medications   molnupiravir EUA 200 mg CAPS    Sig: Take 4 capsules (800 mg total) by mouth 2 (two) times daily for 5 days.    Dispense:  40 capsule    Refill:  0   benzonatate (TESSALON PERLES) 100 MG capsule    Sig: Take 1 capsule (100 mg total) by mouth 3 (three) times daily as needed.    Dispense:  20 capsule    Refill:  0     -can use tylenol or aleve if needed for fevers, aches and pains per instructions  -can use nasal saline a few times per day if you have nasal congestion; sometimes  a short course of Afrin nasal spray for 3 days can help with symptoms as well  -stay hydrated, drink plenty of fluids and eat small healthy meals - avoid dairy  -can take 1000 IU (14mcg) Vit D3 and 100-500 mg of Vit C daily per instructions  -If the Covid test is positive, check out the [facility] website for more information on home care, transmission and treatment for COVID19  -follow up with your doctor in 2-3 days unless improving and feeling better  -stay home while sick, except to seek medical care. If you have COVID19, ideally it would be best to stay home for a full 10 days since the onset of symptoms PLUS one day of no fever and feeling better. Wear a good mask that fits snugly (such as N95 or KN95) if around others to reduce the risk of transmission.  It was nice to meet you today, and I really hope you are feeling better soon. I help [facility] out with telemedicine visits on Tuesdays and Thursdays and am available for visits on those days. If you have any concerns or questions following this visit please schedule a follow up visit with your Primary Care doctor or seek care at a local urgent care clinic to avoid delays in care.    Seek in person care or schedule a follow up video visit promptly if your symptoms worsen, new concerns arise or you are not improving with treatment. Call 911  and/or seek emergency care if your symptoms are severe or life threatening.     Fact Sheet for Patients And Caregivers Emergency Use Authorization (EUA) Of LAGEVRIOT (molnupiravir) capsules For Coronavirus Disease 2019 (COVID-19)  What is the most important information I should know about LAGEVRIO? LAGEVRIO may cause serious side effects, including: ? LAGEVRIO may cause harm to your unborn baby. It is not known if LAGEVRIO will harm your baby if you take LAGEVRIO during pregnancy. o LAGEVRIO is not recommended for use in pregnancy. o LAGEVRIO has not been studied in pregnancy. LAGEVRIO was studied in pregnant animals only. When LAGEVRIO was given to pregnant animals, LAGEVRIO caused harm to their unborn babies. o You and your healthcare provider may decide that you should take LAGEVRIO during pregnancy if there are no other COVID-19 treatment options approved or authorized by the FDA that are accessible or clinically appropriate for you. o If you and your healthcare provider decide that you should take LAGEVRIO during pregnancy, you and your healthcare provider should discuss the known and potential benefits and the potential risks of taking LAGEVRIO during pregnancy. For individuals who are able to become pregnant: ? You should use a reliable method of birth control (contraception) consistently and correctly during treatment with LAGEVRIO and for  4 days after the last dose of LAGEVRIO. Talk to your healthcare provider about reliable birth control methods. ? Before starting treatment with [facility] your healthcare provider may do a pregnancy test to see if you are pregnant before starting treatment with LAGEVRIO. ? Tell your healthcare provider right away if you become pregnant or think you may be pregnant during treatment with LAGEVRIO. Pregnancy Surveillance Program: ? There is a pregnancy surveillance program for individuals who take LAGEVRIO during pregnancy. The purpose of this  program is to collect information about the health of you and your baby. Talk to your healthcare provider about how to take part in this program. ? If you take LAGEVRIO during pregnancy and you agree to participate in the pregnancy surveillance program and allow your healthcare provider to share your information with [place], then your healthcare provider will report your use of [place] during pregnancy to [place]. by calling [phone] or PeacefulBlog.es. For individuals who are sexually active with partners who are able to become pregnant: ? It is not known if LAGEVRIO can affect sperm. While the risk is regarded as low, animal studies to fully assess the potential for LAGEVRIO to affect the babies of males treated with LAGEVRIO have not been completed. A reliable method of birth control (contraception) should be used consistently and correctly during treatment with LAGEVRIO and for at least 3 months after the last dose. The risk to sperm beyond 3 months is not known. Studies to understand the risk to sperm beyond 3 months are ongoing. Talk to your healthcare provider about reliable birth control methods. Talk to your healthcare provider if you have questions or concerns about how LAGEVRIO may affect sperm. You are being given this fact sheet because your healthcare provider believes it is necessary to provide you with LAGEVRIO for the treatment of adults with mild-to-moderate coronavirus disease 2019 (COVID-19) with positive results of direct SARS-CoV-2 viral testing, and who are at high risk for progression to severe COVID-19 including hospitalization or death, and for whom other COVID-19 treatment options approved or authorized by the FDA are not accessible or clinically appropriate. The U.S. Food and Drug Administration (FDA) has issued an Emergency Use Authorization (EUA) to make LAGEVRIO available during the COVID-19 pandemic (for more  details about an EUA please see "What is an Emergency Use Authorization?" at the end of this document). LAGEVRIO is not an FDA-approved medicine in the [place]. Read this Fact Sheet for information about LAGEVRIO. Talk to your healthcare provider about your options if you have any questions. It is your choice to take LAGEVRIO.  What is COVID-19? COVID-19 is caused by a virus called a coronavirus. You can get COVID-19 through close contact with another person who has the virus. COVID-19 illnesses have ranged from very mild-to-severe, including illness resulting in death. While information so far suggests that most COVID-19 illness is mild, serious illness can happen and may cause some of your other medical conditions to become worse. Older people and people of all ages with severe, long lasting (chronic) medical conditions like heart disease, lung disease and diabetes, for example seem to be at higher risk of being hospitalized for COVID-19.  What is LAGEVRIO? LAGEVRIO is an investigational medicine used to treat mild-to-moderate COVID-19 in adults: ? with positive results of direct SARS-CoV-2 viral testing, and ? who are at high risk for progression to severe COVID-19 including hospitalization or death, and for whom other COVID-19 treatment options approved or authorized  by the FDA are not accessible or clinically appropriate. The FDA has authorized the emergency use of LAGEVRIO for the treatment of mild-tomoderate COVID-19 in adults under an EUA. For more information on EUA, see the "What is an Emergency Use Authorization (EUA)?" section at the end of this Fact Sheet. LAGEVRIO is not authorized: ? for use in people less than 65 years of age. ? for prevention of COVID-19. ? for people needing hospitalization for COVID-19. ? for use for longer than 5 consecutive days.  What should I tell my healthcare provider before I take LAGEVRIO? Tell your healthcare provider if you: ? Have  any allergies ? Are breastfeeding or plan to breastfeed ? Have any serious illnesses ? Are taking any medicines (prescription, over-the-counter, vitamins, or herbal products).  How do I take LAGEVRIO? ? Take LAGEVRIO exactly as your healthcare provider tells you to take it. ? Take 4 capsules of LAGEVRIO every 12 hours (for example, at 8 am and at 8 pm) ? Take LAGEVRIO for 5 days. It is important that you complete the full 5 days of treatment with LAGEVRIO. Do not stop taking LAGEVRIO before you complete the full 5 days of treatment, even if you feel better. ? Take LAGEVRIO with or without food. ? You should stay in isolation for as long as your healthcare provider tells you to. Talk to your healthcare provider if you are not sure about how to properly isolate while you have COVID-19. ? Swallow LAGEVRIO capsules whole. Do not open, break, or crush the capsules. If you cannot swallow capsules whole, tell your healthcare provider. ? What to do if you miss a dose: o If it has been less than 10 hours since the missed dose, take it as soon as you remember o If it has been more than 10 hours since the missed dose, skip the missed dose and take your dose at the next scheduled time. ? Do not double the dose of LAGEVRIO to make up for a missed dose.  What are the important possible side effects of LAGEVRIO? ? See, "What is the most important information I should know about LAGEVRIO?" ? Allergic Reactions. Allergic reactions can happen in people taking LAGEVRIO, even after only 1 dose. Stop taking LAGEVRIO and call your healthcare provider right away if you get any of the following symptoms of an allergic reaction: o hives o rapid heartbeat o trouble swallowing or breathing o swelling of the mouth, lips, or face o throat tightness o hoarseness o skin rash The most common side effects of LAGEVRIO are: ? diarrhea ? nausea ? dizziness These are not all the possible side effects of LAGEVRIO.  Not many people have taken LAGEVRIO. Serious and unexpected side effects may happen. This medicine is still being studied, so it is possible that all of the risks are not known at this time.  What other treatment choices are there?  Veklury (remdesivir) is FDA-approved as an intravenous (IV) infusion for the treatment of mildto-moderate OVFIE-33 in certain adults and children. Talk with your doctor to see if Marijean Heath is appropriate for you. Like LAGEVRIO, FDA may also allow for the emergency use of other medicines to treat people with COVID-19. Go to LacrosseProperties.si for more information. It is your choice to be treated or not to be treated with LAGEVRIO. Should you decide not to take it, it will not change your standard medical care.  What if I am breastfeeding? Breastfeeding is not recommended during treatment with LAGEVRIO and for 4  days after the last dose of LAGEVRIO. If you are breastfeeding or plan to breastfeed, talk to your healthcare provider about your options and specific situation before taking LAGEVRIO.  How do I report side effects with LAGEVRIO? Contact your healthcare provider if you have any side effects that bother you or do not go away. Report side effects to FDA MedWatch at SmoothHits.hu or call 1-800-FDA-1088 (1- 415-820-3711).  How should I store [place]? ? Store LAGEVRIO capsules at room temperature between 38F to 96F (20C to 25C). ? Keep LAGEVRIO and all medicines out of the reach of children and pets. How can I learn more about COVID-19? ? Ask your healthcare provider. ? Visit SeekRooms.co.uk ? Contact your local or state public health department. ? Call [place] at [phone] (toll free in the [place] ? Visit www.molnupiravir.com  What Is an Emergency Use Authorization (EUA)? The [place] FDA has made [place]  available under an emergency access mechanism called an Emergency Use Authorization (EUA) The EUA is supported by a Presenter, broadcasting Health and Human Service (HHS) declaration that circumstances exist to justify emergency use of drugs and biological products during the COVID-19 pandemic. LAGEVRIO for the treatment of mild-to-moderate COVID-19 in adults with positive results of direct SARS-CoV-2 viral testing, who are at high risk for progression to severe COVID-19, including hospitalization or death, and for whom alternative COVID-19 treatment options approved or authorized by FDA are not accessible or clinically appropriate, has not undergone the same type of review as an FDA-approved product. In issuing an EUA under the AVWPV-94 public health emergency, the FDA has determined, among other things, that based on the total amount of scientific evidence available including data from adequate and well-controlled clinical trials, if available, it is reasonable to believe that the product may be effective for diagnosing, treating, or preventing COVID-19, or a serious or life-threatening disease or condition caused by COVID-19; that the known and potential benefits of the product, when used to diagnose, treat, or prevent such disease or condition, outweigh the known and potential risks of such product; and that there are no adequate, approved, and available alternatives.  All of these criteria must be met to allow for the product to be used in the treatment of patients during the COVID-19 pandemic. The EUA for LAGEVRIO is in effect for the duration of the COVID-19 declaration justifying emergency use of LAGEVRIO, unless terminated or revoked (after which LAGEVRIO may no longer be used under the EUA). For patent information: http://rogers.info/ Copyright  2021-2022 [place], [place] and its affiliates. All rights reserved. [id number] Revised: March 2022

## 2020-08-08 NOTE — Telephone Encounter (Signed)
   Patient calling to report she is covid+, home test 7/20  Cough, congestion, body ache,sore throat Virtual appt scheduled with Dr Maudie Mercury 7/21

## 2020-11-05 ENCOUNTER — Other Ambulatory Visit: Payer: Self-pay | Admitting: Obstetrics

## 2020-11-05 DIAGNOSIS — N644 Mastodynia: Secondary | ICD-10-CM

## 2020-11-20 ENCOUNTER — Ambulatory Visit
Admission: RE | Admit: 2020-11-20 | Discharge: 2020-11-20 | Disposition: A | Discharge status: Home / Self Care | Payer: 59 | Source: Ambulatory Visit | Attending: Obstetrics | Admitting: Obstetrics

## 2020-11-20 ENCOUNTER — Other Ambulatory Visit: Payer: Self-pay

## 2020-11-20 DIAGNOSIS — N644 Mastodynia: Secondary | ICD-10-CM

## 2020-12-02 ENCOUNTER — Encounter: Payer: Self-pay | Admitting: Gastroenterology

## 2021-01-28 ENCOUNTER — Other Ambulatory Visit (INDEPENDENT_AMBULATORY_CARE_PROVIDER_SITE_OTHER): Payer: Managed Care, Other (non HMO)

## 2021-01-28 ENCOUNTER — Encounter: Payer: Self-pay | Admitting: Gastroenterology

## 2021-01-28 ENCOUNTER — Ambulatory Visit: Payer: Managed Care, Other (non HMO) | Admitting: Gastroenterology

## 2021-01-28 VITALS — BP 128/70 | HR 70 | Ht 63.0 in | Wt 256.2 lb

## 2021-01-28 DIAGNOSIS — R7989 Other specified abnormal findings of blood chemistry: Secondary | ICD-10-CM

## 2021-01-28 LAB — IBC + FERRITIN
Ferritin: 211.9 ng/mL (ref 10.0–291.0)
Iron: 103 ug/dL (ref 42–145)
Saturation Ratios: 31.6 % (ref 20.0–50.0)
TIBC: 326.2 ug/dL (ref 250.0–450.0)
Transferrin: 233 mg/dL (ref 212.0–360.0)

## 2021-01-28 LAB — HEPATIC FUNCTION PANEL
ALT: 83 U/L — ABNORMAL HIGH (ref 0–35)
AST: 57 U/L — ABNORMAL HIGH (ref 0–37)
Albumin: 3.9 g/dL (ref 3.5–5.2)
Alkaline Phosphatase: 80 U/L (ref 39–117)
Bilirubin, Direct: 0.1 mg/dL (ref 0.0–0.3)
Total Bilirubin: 0.7 mg/dL (ref 0.2–1.2)
Total Protein: 7.6 g/dL (ref 6.0–8.3)

## 2021-01-28 NOTE — Patient Instructions (Addendum)
Your provider has requested that you go to the basement level for lab work before leaving today. Press "B" on the elevator. The lab is located at the first door on the left as you exit the elevator.  You have been scheduled for an abdominal ultrasound at [facility] (1st floor of hospital) on 02/04/21 at 10:00am. Please arrive 15 minutes prior to your appointment for registration. Make certain not to have anything to eat or drink 6 hours prior to your appointment. Should you need to reschedule your appointment, please contact radiology at [phone]. This test typically takes about 30 minutes to perform.  Due to recent changes in healthcare laws, you may see the results of your imaging and laboratory studies on MyChart before your provider has had a chance to review them.  We understand that in some cases there may be results that are confusing or concerning to you. Not all laboratory results come back in the same time frame and the provider may be waiting for multiple results in order to interpret others.  Please give [place] 48 hours in order for your provider to thoroughly review all the results before contacting the office for clarification of your results.   The [facility] providers would like to encourage you to use [facility] to communicate with providers for non-urgent requests or questions.  Due to long hold times on the telephone, sending your provider a message by [facility] may be a faster and more efficient way to get a response.  Please allow 48 business hours for a response.  Please remember that this is for non-urgent requests.   Thank you for choosing me and [facility].  Pricilla Riffle. Dagoberto Ligas., MD., Marval Regal

## 2021-01-28 NOTE — Progress Notes (Signed)
History of Present Illness: This is a 61 year old female referred by Plotnikov, Evie Lacks, MD for the evaluation of elevated LFTs. ALT minimally elevated at 47 in July 2021.  AST 96, ALT 93 in October 2022.  She relates no prior history of liver problems.  She relates that her father developed cirrhosis which he related was a "genetic cirrhosis" in his 80s.  She does not drink alcohol.  She has mild constipation that is treated with a magnesium supplement otherwise she has no gastrointestinal complaints. Denies weight loss, abdominal pain, diarrhea, change in stool caliber, melena, hematochezia, nausea, vomiting, dysphagia, reflux symptoms, chest pain.    No Known Allergies Outpatient Medications Prior to Visit  Medication Sig Dispense Refill   Cholecalciferol (VITAMIN D3) 50 MCG (2000 UT) capsule Take 1 capsule (2,000 Units total) by mouth daily. 100 capsule 3   magnesium 30 MG tablet Take 500 mg by mouth at bedtime.      OIL OF OREGANO PO Take 2 each by mouth 3 times/day as needed-between meals & bedtime.     Omega-3 Fatty Acids (FISH OIL) 1000 MG CAPS Take by mouth daily.     benzonatate (TESSALON PERLES) 100 MG capsule Take 1 capsule (100 mg total) by mouth 3 (three) times daily as needed. 20 capsule 0   Diclofenac Sodium (PENNSAID) 2 % SOLN Place 1 application onto the skin 2 (two) times daily. (Patient not taking: Reported on 01/28/2021) 112 g 2   lidocaine (LIDODERM) 5 % Place 1 patch onto the skin daily. Remove & Discard patch within 12 hours or as directed by MD (Patient not taking: Reported on 01/28/2021) 30 patch 0   meloxicam (MOBIC) 15 MG tablet Take 1 tablet (15 mg total) by mouth daily as needed for pain. (Patient not taking: Reported on 07/11/2019) 30 tablet 0   methocarbamol (ROBAXIN) 500 MG tablet Take 1 tablet (500 mg total) by mouth at bedtime as needed for muscle spasms. (Patient not taking: Reported on 01/28/2021) 10 tablet 0   nabumetone (RELAFEN) 500 MG tablet Take 1 tablet  (500 mg total) by mouth 2 (two) times daily as needed for moderate pain. (Patient not taking: Reported on 01/28/2021) 60 tablet 1   No facility-administered medications prior to visit.   Past Medical History:  Diagnosis Date   Anxiety    Genital herpes    Heme positive stool    Sleep apnea    uses c-pap   Uterine leiomyoma    Past Surgical History:  Procedure Laterality Date   abd infection     past hysterectomy   ABDOMINAL HYSTERECTOMY     APPENDECTOMY     BILATERAL OOPHORECTOMY     BREAST REDUCTION SURGERY     COLONOSCOPY     Social History   Socioeconomic History   Marital status: Married    Spouse name: Not on file   Number of children: Not on file   Years of education: Not on file   Highest education level: Not on file  Occupational History   Not on file  Tobacco Use   Smoking status: Never   Smokeless tobacco: Never  Vaping Use   Vaping Use: Never used  Substance and Sexual Activity   Alcohol use: No   Drug use: No   Sexual activity: Yes    Birth control/protection: None  Other Topics Concern   Not on file  Social History Narrative   Not on file   Social Determinants of Health  Financial Resource Strain: Not on file  Food Insecurity: Not on file  Transportation Needs: Not on file  Physical Activity: Not on file  Stress: Not on file  Social Connections: Not on file   Family History  Problem Relation Age of Onset   Breast cancer Mother    Kidney disease Mother    Cancer Father    Prostate cancer Father    Liver cancer Father    Pulmonary embolism Sister    Colon cancer Neg Hx        Review of Systems: Pertinent positive and negative review of systems were noted in the above HPI section. All other review of systems were otherwise negative.   Physical Exam: General: Well developed, well nourished, no acute distress Head: Normocephalic and atraumatic Eyes: Sclerae anicteric, EOMI Ears: Normal auditory acuity Mouth: Not examined, mask on  during Covid-19 pandemic Neck: Supple, no masses or thyromegaly Lungs: Clear throughout to auscultation Heart: Regular rate and rhythm; no murmurs, rubs or bruits Abdomen: Soft, non tender and non distended. No masses, hepatosplenomegaly or hernias noted. Normal Bowel sounds Rectal: Not done Musculoskeletal: Symmetrical with no gross deformities  Skin: No lesions on visible extremities Pulses:  Normal pulses noted Extremities: No clubbing, cyanosis, edema or deformities noted Neurological: Alert oriented x 4, grossly nonfocal Cervical Nodes:  No significant cervical adenopathy Inguinal Nodes: No significant inguinal adenopathy Psychological:  Alert and cooperative. Normal mood and affect   Assessment and Recommendations:  Elevated transaminases. BMI=45.38.  Rule out hepatic steatosis and other causes.  Schedule RUQ [place].  Repeat LFTs today.  Send standard hepatic serologies and PT/INR.  A long-term carb modified, fat modified weight loss diet supervised by her PCP is recommended.  REV in 6 weeks.  CRC screening, average risk.  A 10-year interval colonoscopy is recommended in March 2026.   cc: Plotnikov, Evie Lacks, MD [address] [place],  [place] [address]

## 2021-01-31 LAB — ANA: Anti Nuclear Antibody (ANA): POSITIVE — AB

## 2021-01-31 LAB — ANTI-NUCLEAR AB-TITER (ANA TITER)
ANA TITER: 1:80 {titer} — ABNORMAL HIGH
ANA Titer 1: 1:40 {titer} — ABNORMAL HIGH

## 2021-01-31 LAB — TISSUE TRANSGLUTAMINASE, IGA: (tTG) Ab, IgA: <1 U/mL

## 2021-01-31 LAB — HEPATITIS C ANTIBODY
Hepatitis C Ab: NONREACTIVE
SIGNAL TO CUT-OFF: 0.14 (ref <1.00)

## 2021-01-31 LAB — HEPATITIS B SURFACE ANTIGEN: Hepatitis B Surface Ag: NONREACTIVE

## 2021-01-31 LAB — ANTI-SMOOTH MUSCLE ANTIBODY, IGG: Actin (Smooth Muscle) Antibody (IGG): <20 U (ref <20)

## 2021-01-31 LAB — HEPATITIS B SURFACE ANTIBODY,QUALITATIVE: Hep B S Ab: NONREACTIVE

## 2021-01-31 LAB — MITOCHONDRIAL ANTIBODIES: Mitochondrial M2 Ab, IgG: <=20 U (ref <=20.0)

## 2021-01-31 LAB — CERULOPLASMIN: Ceruloplasmin: 28 mg/dL (ref 18–53)

## 2021-01-31 LAB — ALPHA-1-ANTITRYPSIN: A-1 Antitrypsin, Ser: 165 mg/dL (ref 83–199)

## 2021-01-31 LAB — IGA: Immunoglobulin A: 220 mg/dL (ref 47–310)

## 2021-01-31 LAB — ANGIOTENSIN CONVERTING ENZYME: Angiotensin-Converting Enzyme: 67 U/L (ref 9–67)

## 2021-02-04 ENCOUNTER — Ambulatory Visit (HOSPITAL_COMMUNITY): Payer: Managed Care, Other (non HMO)

## 2021-02-12 ENCOUNTER — Ambulatory Visit (HOSPITAL_COMMUNITY)
Admission: RE | Admit: 2021-02-12 | Discharge: 2021-02-12 | Disposition: A | Discharge status: Home / Self Care | Payer: Commercial Managed Care - HMO | Source: Ambulatory Visit | Attending: Gastroenterology | Admitting: Gastroenterology

## 2021-02-12 ENCOUNTER — Other Ambulatory Visit: Payer: Self-pay

## 2021-02-12 DIAGNOSIS — R7989 Other specified abnormal findings of blood chemistry: Secondary | ICD-10-CM | POA: Insufficient documentation

## 2021-03-18 ENCOUNTER — Ambulatory Visit: Payer: Managed Care, Other (non HMO) | Admitting: Gastroenterology

## 2021-04-08 ENCOUNTER — Ambulatory Visit: Payer: Managed Care, Other (non HMO) | Admitting: Gastroenterology

## 2021-04-18 ENCOUNTER — Ambulatory Visit (INDEPENDENT_AMBULATORY_CARE_PROVIDER_SITE_OTHER): Payer: Managed Care, Other (non HMO) | Admitting: Internal Medicine

## 2021-04-18 ENCOUNTER — Telehealth: Payer: Self-pay

## 2021-04-18 ENCOUNTER — Encounter: Payer: Self-pay | Admitting: Internal Medicine

## 2021-04-18 VITALS — BP 120/68 | HR 66 | Resp 18 | Ht 63.0 in | Wt 259.8 lb

## 2021-04-18 DIAGNOSIS — R7303 Prediabetes: Secondary | ICD-10-CM

## 2021-04-18 DIAGNOSIS — Z0000 Encounter for general adult medical examination without abnormal findings: Secondary | ICD-10-CM | POA: Diagnosis not present

## 2021-04-18 DIAGNOSIS — K760 Fatty (change of) liver, not elsewhere classified: Secondary | ICD-10-CM | POA: Diagnosis not present

## 2021-04-18 DIAGNOSIS — Z23 Encounter for immunization: Secondary | ICD-10-CM

## 2021-04-18 DIAGNOSIS — Z0001 Encounter for general adult medical examination with abnormal findings: Secondary | ICD-10-CM

## 2021-04-18 LAB — VITAMIN D 25 HYDROXY (VIT D DEFICIENCY, FRACTURES): VITD: 80.81 ng/mL (ref 30.00–100.00)

## 2021-04-18 LAB — CBC
HCT: 42.6 % (ref 36.0–46.0)
Hemoglobin: 14 g/dL (ref 12.0–15.0)
MCHC: 32.9 g/dL (ref 30.0–36.0)
MCV: 92.5 fl (ref 78.0–100.0)
Platelets: 214 10*3/uL (ref 150.0–400.0)
RBC: 4.61 Mil/uL (ref 3.87–5.11)
RDW: 14 % (ref 11.5–15.5)
WBC: 4 10*3/uL (ref 4.0–10.5)

## 2021-04-18 LAB — LIPID PANEL
Cholesterol: 205 mg/dL — ABNORMAL HIGH (ref 0–200)
HDL: 58.5 mg/dL (ref >39.00)
LDL Cholesterol: 128 mg/dL — ABNORMAL HIGH (ref 0–99)
NonHDL: 146.57
Total CHOL/HDL Ratio: 4
Triglycerides: 95 mg/dL (ref 0.0–149.0)
VLDL: 19 mg/dL (ref 0.0–40.0)

## 2021-04-18 LAB — COMPREHENSIVE METABOLIC PANEL
ALT: 112 U/L — ABNORMAL HIGH (ref 0–35)
AST: 97 U/L — ABNORMAL HIGH (ref 0–37)
Albumin: 4.1 g/dL (ref 3.5–5.2)
Alkaline Phosphatase: 83 U/L (ref 39–117)
BUN: 12 mg/dL (ref 6–23)
CO2: 29 mEq/L (ref 19–32)
Calcium: 9.8 mg/dL (ref 8.4–10.5)
Chloride: 103 mEq/L (ref 96–112)
Creatinine, Ser: 1.03 mg/dL (ref 0.40–1.20)
GFR: 59.91 mL/min — ABNORMAL LOW (ref >60.00)
Glucose, Bld: 109 mg/dL — ABNORMAL HIGH (ref 70–99)
Potassium: 4.3 mEq/L (ref 3.5–5.1)
Sodium: 138 mEq/L (ref 135–145)
Total Bilirubin: 0.6 mg/dL (ref 0.2–1.2)
Total Protein: 7.7 g/dL (ref 6.0–8.3)

## 2021-04-18 LAB — HEMOGLOBIN A1C: Hgb A1c MFr Bld: 6.6 % — ABNORMAL HIGH (ref 4.6–6.5)

## 2021-04-18 LAB — TSH: TSH: 1.54 u[IU]/mL (ref 0.35–5.50)

## 2021-04-18 NOTE — Telephone Encounter (Signed)
-----   Message from Ladene Artist, MD sent at 04/18/2021  1:34 PM EDT ----- ?Yes we will schedule the 2nd dose at that visit. Thank you, Norberto Sorenson  ? ?----- Message ----- ?From: Hoyt Koch, MD ?Sent: 04/18/2021  12:50 PM EDT ?To: Ladene Artist, MD ? ?Noticed she was not immune to hep b on your recent labs. Given 1st dose heplisav today. She sees you 05/21/21 can you give 2nd dose at that visit? If not let us know thanks Delma Officer ? ?

## 2021-04-18 NOTE — Progress Notes (Signed)
? ?  Subjective:  ? ?Patient ID: Carol Conner, female    DOB: 1960-08-20, 61 y.o.   MRN: [id number] ? ?HPI ?The patient is a 61 YO female coming in for concerns. Also desires physical. ? ?PMH, [facility], social history reviewed and updated ? ?Review of Systems  ?Constitutional: Negative.   ?HENT: Negative.    ?Eyes: Negative.   ?Respiratory:  Negative for cough, chest tightness and shortness of breath.   ?Cardiovascular:  Negative for chest pain, palpitations and leg swelling.  ?Gastrointestinal:  Negative for abdominal distention, abdominal pain, constipation, diarrhea, nausea and vomiting.  ?Musculoskeletal: Negative.   ?Skin: Negative.   ?Neurological: Negative.   ?Psychiatric/Behavioral: Negative.    ? ?Objective:  ?Physical Exam ?Constitutional:   ?   Appearance: She is well-developed. She is obese.  ?HENT:  ?   Head: Normocephalic and atraumatic.  ?Cardiovascular:  ?   Rate and Rhythm: Normal rate and regular rhythm.  ?Pulmonary:  ?   Effort: Pulmonary effort is normal. No respiratory distress.  ?   Breath sounds: Normal breath sounds. No wheezing or rales.  ?Abdominal:  ?   General: Bowel sounds are normal. There is no distension.  ?   Palpations: Abdomen is soft.  ?   Tenderness: There is no abdominal tenderness. There is no rebound.  ?Musculoskeletal:  ?   Cervical back: Normal range of motion.  ?Skin: ?   General: Skin is warm and dry.  ?Neurological:  ?   Mental Status: She is alert and oriented to person, place, and time.  ?   Coordination: Coordination normal.  ? ? ?Vitals:  ? 04/18/21 0925  ?BP: 120/68  ?Pulse: 66  ?Resp: 18  ?SpO2: 99%  ?Weight: 259 lb 12.8 oz (117.8 kg)  ?Height: '5\' 3"'$  (1.6 m)  ? ? ?This visit occurred during the SARS-CoV-2 public health emergency.  Safety protocols were in place, including screening questions prior to the visit, additional usage of staff PPE, and extensive cleaning of exam room while observing appropriate contact time as indicated for disinfecting solutions.  ? ?Assessment  & Plan:  ?Hepatitis B vaccine heplisav and shingrix given at visit ?

## 2021-04-18 NOTE — Assessment & Plan Note (Signed)
Checking CMP for stability. Recent workup for auto-immune cause negative. [place] reviewed and she does need follow up which GI will arrange for gallbladder polyp. Given heplisav as recent labs with GI not immune and will need second dose (hopefully GI can give she has visit in 1 month) due for completion. Explained this to her.  ?

## 2021-04-18 NOTE — Telephone Encounter (Signed)
Included in follow up visit appointment notes that patient will need second Hep B injection.  ?

## 2021-04-18 NOTE — Assessment & Plan Note (Signed)
Flu shot counseled. Covid-19 counseled. Shingrix 1st given today. Tetanus counseled. Colonoscopy up to date. Mammogram up to date with gyn, pap smear up to date with gyn no longer indicated and hepatitis B heplisav given today, second due in 1 month. Counseled about sun safety and mole surveillance. Counseled about the dangers of distracted driving. Given 10 year screening recommendations.  ? ?

## 2021-04-18 NOTE — Assessment & Plan Note (Signed)
She desires weight loss and if sugars are normal would like to try wegovy. We will check HgA1c and if normal will rx wegovy. Talked about common side effects, administration and how to use. Follow up 3-4 months. ?

## 2021-04-18 NOTE — Patient Instructions (Addendum)
We will check the labs today. ? ?We have given you the hepatitis B vaccine (get the second dose at the GI doctor visit). ? ?We have given you the shingles vaccine today and you will need the second one in 2-3 months. ? ? ?

## 2021-04-18 NOTE — Assessment & Plan Note (Signed)
Recent labs from GI reviewed and lack of immunity to hepatitis B. Given her fatty liver disease I have recommended heplisav for vaccination. Given 1st dose today and will message her GI provider to give 2nd dose at visit with them 05/21/21.  ?

## 2021-04-18 NOTE — Assessment & Plan Note (Addendum)
Last HgA1c 6.2 which was 6-7 years ago. Checking Hga1c today and treat as appropriate. We talked about possibility of progression to diabetes.  ?

## 2021-04-22 ENCOUNTER — Telehealth: Payer: Self-pay | Admitting: Internal Medicine

## 2021-04-22 NOTE — Telephone Encounter (Signed)
Pt checking status of 04-18-2021 lab results, results has nor been resulted ? ?Pt states she spoke w/ Dr. Sharlet Salina regarding prescribing wegovy ? ?Pt requesting a cb for lab results and to discuss if wegovy can be prescribed ?

## 2021-04-24 ENCOUNTER — Other Ambulatory Visit: Payer: Self-pay | Admitting: Internal Medicine

## 2021-04-24 MED ORDER — OZEMPIC (0.25 OR 0.5 MG/DOSE) 2 MG/1.5ML ~~LOC~~ SOPN: PEN_INJECTOR | SUBCUTANEOUS | 0 refills | Status: DC

## 2021-04-24 NOTE — Telephone Encounter (Signed)
See result note.  

## 2021-04-28 ENCOUNTER — Telehealth: Payer: Self-pay

## 2021-04-28 ENCOUNTER — Encounter: Payer: Self-pay | Admitting: Internal Medicine

## 2021-04-28 NOTE — Telephone Encounter (Signed)
Prior authorization has been initiated for Ozempic.  ? ?Key: [id number] ?Rx #: [id number] ?

## 2021-05-08 MED ORDER — TRULICITY 1.5 MG/0.5ML ~~LOC~~ SOAJ: 1.5000 mg | SUBCUTANEOUS | 0 refills | Status: DC

## 2021-05-08 MED ORDER — TRULICITY 0.75 MG/0.5ML ~~LOC~~ SOPN
0.7500 mg | PEN_INJECTOR | SUBCUTANEOUS | 0 refills | Status: AC
Start: 2021-05-08 — End: 2021-06-05

## 2021-05-21 ENCOUNTER — Encounter: Payer: Self-pay | Admitting: Gastroenterology

## 2021-05-21 ENCOUNTER — Ambulatory Visit (INDEPENDENT_AMBULATORY_CARE_PROVIDER_SITE_OTHER): Payer: Commercial Managed Care - HMO | Admitting: Gastroenterology

## 2021-05-21 VITALS — BP 110/78 | HR 76 | Ht 63.0 in | Wt 246.0 lb

## 2021-05-21 DIAGNOSIS — K760 Fatty (change of) liver, not elsewhere classified: Secondary | ICD-10-CM | POA: Diagnosis not present

## 2021-05-21 DIAGNOSIS — K824 Cholesterolosis of gallbladder: Secondary | ICD-10-CM

## 2021-05-21 NOTE — Patient Instructions (Signed)
Remain on a low fat/carb diet.  ? ?Please return to Dr. Charlynne Cousins office for last Hepatitis injection.  ? ?The [place] [facility] providers would like to encourage you to use [facility] to communicate with providers for non-urgent requests or questions.  Due to long hold times on the telephone, sending your provider a message by [facility] may be a faster and more efficient way to get a response.  Please allow 48 business hours for a response.  Please remember that this is for non-urgent requests.  ? ?Thank you for choosing me and [facility]. ? ?Malcolm T. Dagoberto Ligas., MD., [facility] ? ?

## 2021-05-21 NOTE — Progress Notes (Signed)
? ?  Assessment   ?  ?Elevated LFTs due to hepatic steatosis ?Gallbladder polyp, 9 mm ? ?Recommendations  ?  ?Carb modified, fat modified, weight loss diet supervised by her PCP ?Repeat RUQ [place] at 6 months for follow up of GB polyp ?Screening colonoscopy March 2026 ? ? ?HPI  ?  ?This is a 61 year old female returns for follow-up of elevated LFTs felt secondary to hepatic steatosis and for a 9 mm gallbladder polyp.  She has no ongoing gastrointestinal complaints.  We reviewed her LFT evaluation and surveillance of her gallbladder polyp. ? ? ?Labs / Imaging  ?  ? ?  Latest Ref Rng & Units 04/18/2021  ? 10:07 AM 01/28/2021  ? 10:53 AM 07/24/2019  ?  1:20 PM  ?Hepatic Function  ?Total Protein 6.0 - 8.3 g/dL 7.7   7.6   8.0    ?Albumin 3.5 - 5.2 g/dL 4.1   3.9   3.9    ?AST 0 - 37 U/L 97   57   34    ?ALT 0 - 35 U/L 112   83   47    ?Alk Phosphatase 39 - 117 U/L 83   80   80    ?Total Bilirubin 0.2 - 1.2 mg/dL 0.6   0.7   0.5    ?Bilirubin, Direct 0.0 - 0.3 mg/dL  0.1     ? ? ? ?  Latest Ref Rng & Units 04/18/2021  ? 10:07 AM 07/24/2019  ?  1:20 PM 09/13/2009  ?  9:15 AM  ?CBC  ?WBC 4.0 - 10.5 K/uL 4.0   4.0   5.7    ?Hemoglobin 12.0 - 15.0 g/dL 14.0   14.0   9.9    ?Hematocrit 36.0 - 46.0 % 42.6   44.1   30.3    ?Platelets 150.0 - 400.0 K/uL 214.0   217   346    ? ? ?Current Medications, Allergies, Past Medical History, Past Surgical History, Family History and Social History were reviewed in [employer] record. ? ? ?Physical Exam: ?General: Well developed, well nourished, no acute distress ?Head: Normocephalic and atraumatic ?Eyes: Sclerae anicteric, EOMI ?Ears: Normal auditory acuity ?Mouth: Not examined, mask on during Covid-19 pandemic ?Neurological: Alert oriented x 4, grossly nonfocal ?Psychological:  Alert and cooperative. Normal mood and affect ? ? ?Pricilla Riffle. Fuller Plan, MD 05/21/2021, 11:18 AM  ?

## 2021-06-26 ENCOUNTER — Ambulatory Visit (INDEPENDENT_AMBULATORY_CARE_PROVIDER_SITE_OTHER): Payer: Commercial Managed Care - HMO

## 2021-06-26 DIAGNOSIS — Z23 Encounter for immunization: Secondary | ICD-10-CM

## 2021-06-26 NOTE — Progress Notes (Cosign Needed Addendum)
Pt received 2nd shingles vaccine w/o complications   Medical screening examination/treatment/procedure(s) were performed by non-physician practitioner and as supervising physician I was immediately available for consultation/collaboration.  I agree with above. Lew Dawes, MD

## 2021-07-01 ENCOUNTER — Encounter: Payer: Self-pay | Admitting: Internal Medicine

## 2021-07-04 ENCOUNTER — Other Ambulatory Visit: Payer: Self-pay | Admitting: Internal Medicine

## 2021-07-17 ENCOUNTER — Encounter: Payer: Self-pay | Admitting: Physician Assistant

## 2021-07-17 ENCOUNTER — Ambulatory Visit: Payer: Commercial Managed Care - HMO | Admitting: Physician Assistant

## 2021-07-17 ENCOUNTER — Other Ambulatory Visit (INDEPENDENT_AMBULATORY_CARE_PROVIDER_SITE_OTHER): Payer: Commercial Managed Care - HMO

## 2021-07-17 VITALS — BP 120/80 | HR 68 | Ht 63.0 in | Wt 251.0 lb

## 2021-07-17 DIAGNOSIS — R1011 Right upper quadrant pain: Secondary | ICD-10-CM | POA: Diagnosis not present

## 2021-07-17 DIAGNOSIS — K824 Cholesterolosis of gallbladder: Secondary | ICD-10-CM

## 2021-07-17 DIAGNOSIS — K760 Fatty (change of) liver, not elsewhere classified: Secondary | ICD-10-CM | POA: Diagnosis not present

## 2021-07-17 DIAGNOSIS — Z1211 Encounter for screening for malignant neoplasm of colon: Secondary | ICD-10-CM | POA: Diagnosis not present

## 2021-07-17 LAB — CBC WITH DIFFERENTIAL/PLATELET
Basophils Absolute: 0 10*3/uL (ref 0.0–0.1)
Basophils Relative: 0.9 % (ref 0.0–3.0)
Eosinophils Absolute: 0.1 10*3/uL (ref 0.0–0.7)
Eosinophils Relative: 2.3 % (ref 0.0–5.0)
HCT: 43.6 % (ref 36.0–46.0)
Hemoglobin: 14.3 g/dL (ref 12.0–15.0)
Lymphocytes Relative: 51.7 % — ABNORMAL HIGH (ref 12.0–46.0)
Lymphs Abs: 2.1 10*3/uL (ref 0.7–4.0)
MCHC: 32.7 g/dL (ref 30.0–36.0)
MCV: 91.8 fl (ref 78.0–100.0)
Monocytes Absolute: 0.2 10*3/uL (ref 0.1–1.0)
Monocytes Relative: 5.7 % (ref 3.0–12.0)
Neutro Abs: 1.6 10*3/uL (ref 1.4–7.7)
Neutrophils Relative %: 39.4 % — ABNORMAL LOW (ref 43.0–77.0)
Platelets: 214 10*3/uL (ref 150.0–400.0)
RBC: 4.75 Mil/uL (ref 3.87–5.11)
RDW: 14.2 % (ref 11.5–15.5)
WBC: 4 10*3/uL (ref 4.0–10.5)

## 2021-07-17 LAB — COMPREHENSIVE METABOLIC PANEL
ALT: 81 U/L — ABNORMAL HIGH (ref 0–35)
AST: 65 U/L — ABNORMAL HIGH (ref 0–37)
Albumin: 4.3 g/dL (ref 3.5–5.2)
Alkaline Phosphatase: 82 U/L (ref 39–117)
BUN: 14 mg/dL (ref 6–23)
CO2: 26 mEq/L (ref 19–32)
Calcium: 9.6 mg/dL (ref 8.4–10.5)
Chloride: 101 mEq/L (ref 96–112)
Creatinine, Ser: 1.15 mg/dL (ref 0.40–1.20)
GFR: 52.4 mL/min — ABNORMAL LOW (ref >60.00)
Glucose, Bld: 92 mg/dL (ref 70–99)
Potassium: 4 mEq/L (ref 3.5–5.1)
Sodium: 135 mEq/L (ref 135–145)
Total Bilirubin: 0.9 mg/dL (ref 0.2–1.2)
Total Protein: 7.9 g/dL (ref 6.0–8.3)

## 2021-07-17 NOTE — Patient Instructions (Signed)
Your provider has requested that you go to the basement level for lab work before leaving today. Press "B" on the elevator. The lab is located at the first door on the left as you exit the elevator.  You have been scheduled for an abdominal ultrasound at [facility] (1st floor of hospital) on 07/24/21 at 11AM. Please arrive 15 minutes prior to your appointment for registration. Make certain not to have anything to eat or drink 6 hours prior to your appointment. Should you need to reschedule your appointment, please contact radiology at [phone]. This test typically takes about 30 minutes to perform.   Please take this medication 30 minutes to 1 hour before meals- this makes it more effective.  Avoid spicy and acidic foods Avoid fatty foods Limit your intake of coffee, tea, alcohol, and carbonated drinks Work to maintain a healthy weight Keep the head of the bed elevated at least 3 inches with blocks or a wedge pillow if you are having any nighttime symptoms Stay upright for 2 hours after eating Avoid meals and snacks three to four hours before bedtime Do small frequent meals.   Recommend starting on a fiber supplement, can try metamucil first but if this causes gas/bloating switch to benefiber or citracel, these do not cause gas.  Take with fiber with with a full 8 oz glass of water once a day. This can take 1 month to start helping, so try for at least one month.  Recommend increasing water and physical activity.  Get a squatty potty to use at home or try a stool, goal is to get your knees above your hips during a bowel movement.  - Drink at least 64-80 ounces of water/liquid per day. - Establish a time to try to move your bowels every day.  For many people, this is after a cup of coffee or after a meal such as breakfast. - Sit all of the way back on the toilet keeping your back fairly straight and while sitting up, try to rest the tops of your forearms on your upper thighs.   -  Raising your feet with a step stool/squatty potty can be helpful to improve the angle that allows your stool to pass through the rectum. - Relax the rectum feeling it bulge toward the toilet water.  If you feel your rectum raising toward your body, you are contracting rather than relaxing. - Breathe in and slowly exhale. "Belly breath" by expanding your belly towards your belly button. Keep belly expanded as you gently direct pressure down and back to the anus.  A low pitched GRRR sound can assist with increasing intra-abdominal pressure.  - Repeat 3-4 times. If unsuccessful, contract the pelvic floor to restore normal tone and get off the toilet.  Avoid excessive straining. - To reduce excessive wiping by teaching your anus to normally contract, place hands on outer aspect of knees and resist knee movement outward.  Hold 5-10 second then place hands just inside of knees and resist inward movement of knees.  Hold 5 seconds.  Repeat a few times each way.  Go to the ER if unable to pass gas, severe AB pain, unable to hold down food, any shortness of breath of chest pain.  It was a pleasure to see you today!  Thank you for trusting me with your gastrointestinal care!

## 2021-07-17 NOTE — Progress Notes (Signed)
07/17/2021 Carol Conner [id number] [birth year]  Referring provider: Cassandria Anger, MD Primary GI doctor: Dr. Fuller Plan  ASSESSMENT AND PLAN:   RUQ pain with eating, hepatic steatosis and 9 mm gallbladder polyp Negative hepatic labs Has lost 10 lbs- commended on weight loss Will proceed with RUQ [place], if negative consider HIDA Low fat diet.  Recheck LFTs  Morbid obesity  Body mass index is 44.46 kg/m.  -Patient has been advised to make an attempt to improve diet and exercise patterns to aid in weight loss. -Recommended diet heavy in fruits and veggies and low in animal meats, cheeses, and dairy products, appropriate calorie intake  Screen for colon cancer Due March 2024  Constipation From meds - Increase fiber/ water intake, decrease caffeine, increase activity level.   History of Present Illness:  61 y.o. female  with a past medical history of OSA, fatty liver disease, morbid obesity, diabetes last A1C 6.6, anxiety and others listed below, returns to clinic today for evaluation of hepatic steatosis  Patient's had minimally elevated LFTs since July 2021. For saw Dr. Fuller Plan January, had negative hepatitis studies, has finished hepatitis B vaccinations with primary. Normal liver studies so far: Iron, ferritin. , hepatitis panel, alpha 1 antitrypsin, ceruloplasmin, mitochondrial antibody, Anti-smooth muscle antibody, TTG/IGA, ACE and TSH. ANA was weakly postiive with 1:40 and 1:80.   Most recent ALT is 112, no alk phos elevation.  She is on oregano, Mag O7. No other supplements.  Started on Trulicity 2 months ago for A1c 6.6 and diabetes  Has always had constipation, has had a little more  Has some RUQ burning pain after eating x  Jan, has been getting worse. Has some RUQ back pain as well at the same time. Worse with chocolate.  Has lost 10 lbs with trulicity. No nausea, vomiting, no GERD.   Father had cirrhosis in his 25s. 05/21/2021 saw Dr. Fuller Plan for elevated LFTs  secondary. 02/12/2021 right upper quadrant abdominal ultrasound showed gallbladder sludge, possible 9 mm polyp, no acute cholecystitis, hepatic steatosis.  Recommended 14-monthfollow-up ultrasound. Hepatitis B vaccines through internal medicine Patient denies alcohol use. Recall 10-year interval colonoscopy March 2026  Lab Results  Component Value Date   ALT 112 (H) 04/18/2021   AST 97 (H) 04/18/2021   ALKPHOS 83 04/18/2021   BILITOT 0.6 04/18/2021    Current Medications:   Current Outpatient Medications (Endocrine & Metabolic):    TRULICITY 1.5 MLM/7.8MLSOPN, INJECT 1.5 MG INTO THE SKIN ONCE A WEEK FOR 28 DAYS      Current Outpatient Medications (Other):    Cholecalciferol (VITAMIN D3) 50 MCG (2000 UT) capsule, Take 1 capsule (2,000 Units total) by mouth daily.   Cranberry-Milk Thistle (LIVER & KIDNEY CLEANSER PO), Take by mouth.   magnesium 30 MG tablet, Take 500 mg by mouth at bedtime.    OIL OF OREGANO PO, Take 2 each by mouth 3 times/day as needed-between meals & bedtime.   Omega-3 Fatty Acids (FISH OIL) 1000 MG CAPS, Take by mouth daily.  Surgical History:  She  has a past surgical history that includes Abdominal hysterectomy; Appendectomy; abd infection; Bilateral oophorectomy; Breast reduction surgery; and Colonoscopy. Family History:  Her family history includes Breast cancer in her mother; Cancer in her father; Kidney disease in her mother; Liver cancer in her father; Prostate cancer in her father; Pulmonary embolism in her sister. Social History:   reports that she has never smoked. She has never used smokeless tobacco. She reports that she  does not drink alcohol and does not use drugs.  Current Medications, Allergies, Past Medical History, Past Surgical History, Family History and Social History were reviewed in [employer] record.  Physical Exam: BP 120/80   Pulse 68   Ht _0  (1.6 m)   Wt 251 lb (113.9 kg)   BMI 44.46 kg/m  General:    Pleasant, well developed female in no acute distress Heart : Regular rate and rhythm; no murmurs Pulm: Clear anteriorly; no wheezing Abdomen:  Soft, Obese AB, Active bowel sounds. No tenderness . , No organomegaly appreciated. Rectal: Not evaluated Extremities:  without  edema. Neurologic:  Alert and  oriented x4;  No focal deficits.  Psych:  Cooperative. Normal mood and affect.   Vladimir Crofts, PA-C 07/17/21

## 2021-07-24 ENCOUNTER — Ambulatory Visit (HOSPITAL_COMMUNITY)
Admission: RE | Admit: 2021-07-24 | Discharge: 2021-07-24 | Disposition: A | Discharge status: Home / Self Care | Payer: Commercial Managed Care - HMO | Source: Ambulatory Visit | Attending: Physician Assistant | Admitting: Physician Assistant

## 2021-07-24 DIAGNOSIS — K760 Fatty (change of) liver, not elsewhere classified: Secondary | ICD-10-CM | POA: Insufficient documentation

## 2021-07-24 DIAGNOSIS — R1011 Right upper quadrant pain: Secondary | ICD-10-CM | POA: Diagnosis present

## 2021-07-24 DIAGNOSIS — K824 Cholesterolosis of gallbladder: Secondary | ICD-10-CM | POA: Diagnosis present

## 2021-07-26 ENCOUNTER — Other Ambulatory Visit: Payer: Self-pay | Admitting: Internal Medicine

## 2021-08-01 ENCOUNTER — Ambulatory Visit (INDEPENDENT_AMBULATORY_CARE_PROVIDER_SITE_OTHER): Payer: Commercial Managed Care - HMO | Admitting: Internal Medicine

## 2021-08-01 ENCOUNTER — Encounter: Payer: Self-pay | Admitting: Internal Medicine

## 2021-08-01 ENCOUNTER — Telehealth: Payer: Self-pay | Admitting: *Deleted

## 2021-08-01 VITALS — BP 122/76 | HR 82 | Resp 18 | Ht 63.0 in | Wt 250.8 lb

## 2021-08-01 DIAGNOSIS — E119 Type 2 diabetes mellitus without complications: Secondary | ICD-10-CM | POA: Diagnosis not present

## 2021-08-01 MED ORDER — TRULICITY 1.5 MG/0.5ML ~~LOC~~ SOAJ: 1.5000 mg | SUBCUTANEOUS | 0 refills | Status: DC

## 2021-08-01 MED ORDER — SEMAGLUTIDE (1 MG/DOSE) 4 MG/3ML ~~LOC~~ SOPN: 1.0000 mg | PEN_INJECTOR | SUBCUTANEOUS | 0 refills | Status: DC

## 2021-08-01 MED ORDER — SEMAGLUTIDE (2 MG/DOSE) 8 MG/3ML ~~LOC~~ SOPN: 2.0000 mg | PEN_INJECTOR | SUBCUTANEOUS | 5 refills | Status: DC

## 2021-08-01 NOTE — Assessment & Plan Note (Signed)
She has tried and failed trulicity due to bad burning with injection and would like to try ozempic. We will rx 1 mg which is equivalent dosing and then after 1 month increase to 2 mg weekly and follow up 3-6 months. She will likely need PA.

## 2021-08-01 NOTE — Telephone Encounter (Signed)
Pt on cover-my-meds need PA on Ozempic (1 MG/DOSE) '4MG'$ /3ML pen-injectors. Submitted PA w/ (Key: [id number]). Rec'd msg Your information has been sent to [facility].Marland KitchenJohny Chess

## 2021-08-01 NOTE — Progress Notes (Signed)
   Subjective:   Patient ID: Carol Conner, female    DOB: August 19, 1960, 61 y.o.   MRN: [id number]  HPI The patient is a 61 YO female coming in for follow up new diabetes. Started trulicity [employer] did not approve ozempic). She has tried for 3 months and is having burning with injection. She would like to try ozempic. Down 10 pounds since starting.  Review of Systems  Constitutional: Negative.   HENT: Negative.    Eyes: Negative.   Respiratory:  Negative for cough, chest tightness and shortness of breath.   Cardiovascular:  Negative for chest pain, palpitations and leg swelling.  Gastrointestinal:  Negative for abdominal distention, abdominal pain, constipation, diarrhea, nausea and vomiting.  Musculoskeletal: Negative.   Skin: Negative.   Neurological: Negative.   Psychiatric/Behavioral: Negative.      Objective:  Physical Exam Constitutional:      Appearance: She is well-developed. She is obese.  HENT:     Head: Normocephalic and atraumatic.  Cardiovascular:     Rate and Rhythm: Normal rate and regular rhythm.  Pulmonary:     Effort: Pulmonary effort is normal. No respiratory distress.     Breath sounds: Normal breath sounds. No wheezing or rales.  Abdominal:     General: Bowel sounds are normal. There is no distension.     Palpations: Abdomen is soft.     Tenderness: There is no abdominal tenderness. There is no rebound.  Musculoskeletal:     Cervical back: Normal range of motion.  Skin:    General: Skin is warm and dry.     Comments: Foot exam done  Neurological:     Mental Status: She is alert and oriented to person, place, and time.     Coordination: Coordination normal.     Vitals:   08/01/21 1106  BP: 122/76  Pulse: 82  Resp: 18  SpO2: 98%  Weight: 250 lb 12.8 oz (113.8 kg)  Height: '5\' 3"'$  (1.6 m)    Assessment & Plan:

## 2021-08-01 NOTE — Patient Instructions (Signed)
Ozempic we have sent in 1 mg to do first month and then increase to 2 mg.

## 2021-08-04 NOTE — Telephone Encounter (Signed)
Rec'd determination med was DENIED. It states " There is no indication that your patient has met both of the following: A) Individual will continue maximally tolerated metformin therapy, if not contraindicated per FDA label, intolerant, or otherwise not a candidate; and B) Documentation of one of the following: 1. Unable to achieve goal HbA1C despite metformin or metformin-containing regimen (meglitinides, sulfonylureas, or  thiazolidinediones) at greater than or equal to 1,500 mg per day; 2. Intolerance to metformin 1,500  mg per day despite appropriate dose titration duration (for example, period of 8-12 weeks); 3.  Contraindication to metformin per FDA label (for example, acute/chronic metabolic acidosis, severe  renal dysfunction); 4. Not a candidate for metformin (for example, hepatic impairment, moderate  renal dysfunction, unstable heart failure, individual is using an agent for a non-diabetic FDAapproved indication); 5. Initial metformin combination therapy is clinically appropriate for elevated  HbA1C (for example, greater than 1.5% above goal); or 6. Initial metformin combination therapy is  clinically appropriate in an individual with co-morbid conditions (such as ASCVD, heart failure, or  CKD)"../l,mb 

## 2021-08-13 ENCOUNTER — Telehealth: Payer: Self-pay

## 2021-08-13 NOTE — Telephone Encounter (Signed)
Patient had [place] completed on 07/24/21. [place] recall in for 1 year.

## 2021-08-13 NOTE — Telephone Encounter (Signed)
Message -----  From: Algernon Huxley, RN  Sent: 08/13/2021  12:00 AM EDT  To: Marlon Pel, RN  Subject: [place] of abd                                       Pt needs f/u US in 6 mth to reassess gallbladder polyp.

## 2021-08-22 ENCOUNTER — Other Ambulatory Visit: Payer: Self-pay | Admitting: Internal Medicine

## 2021-10-13 NOTE — Progress Notes (Deleted)
$'@Patient'u$  ID: Carol Conner, female    DOB: 1960/06/02, 61 y.o.   MRN: [id number]  No chief complaint on file.   Referring provider: Plotnikov, Evie Lacks, MD  HPI: 61 year old female, never smoked.  Past medical history significant for apnea.  Patient of Dr. Halford Chessman, last seen on 09/29/2012.  10/14/2021 Patient presents today for overdue follow-up for OSA.  She was last seen in our office in 2014.  During that visit she was ordered for repeat sleep study, he had a home sleep study on/15/2014 that showed mild obstructive sleep apnea, AHI 13.7 an hour with SPO2 low 85%.  At that time an order was sent to new DME company for auto CPAP 5 to 20 cm H2O and for CPAP supplies.         No Known Allergies  Immunization History  Administered Date(s) Administered   Hepatitis B, adult 04/18/2021   PFIZER(Purple Top)SARS-COV-2 Vaccination 03/31/2019, 04/21/2019, 12/13/2019   Zoster Recombinat (Shingrix) 06/26/2021   Zoster, Live 04/18/2021    Past Medical History:  Diagnosis Date   Anxiety    Genital herpes    Heme positive stool    Sleep apnea    uses c-pap   Uterine leiomyoma     Tobacco History: Social History   Tobacco Use  Smoking Status Never  Smokeless Tobacco Never   Counseling given: Not Answered   Outpatient Medications Prior to Visit  Medication Sig Dispense Refill   Cholecalciferol (VITAMIN D3) 50 MCG (2000 UT) capsule Take 1 capsule (2,000 Units total) by mouth daily. 100 capsule 3   Cranberry-Milk Thistle (LIVER & KIDNEY CLEANSER PO) Take by mouth.     Dulaglutide (TRULICITY) 1.5 WT/8.8EK SOPN INJECT 1.5 MG SUBCUTANEOUSLY  ONCE A WEEK 4 mL 1   magnesium 30 MG tablet Take 500 mg by mouth at bedtime.      OIL OF OREGANO PO Take 2 each by mouth 3 times/day as needed-between meals & bedtime.     Omega-3 Fatty Acids (FISH OIL) 1000 MG CAPS Take by mouth daily.     Semaglutide, 1 MG/DOSE, 4 MG/3ML SOPN Inject 1 mg into the skin once a week. 3 mL 0   Semaglutide, 2  MG/DOSE, 8 MG/3ML SOPN Inject 2 mg as directed once a week. 3 mL 5   No facility-administered medications prior to visit.      Review of Systems  Review of Systems   Physical Exam  There were no vitals taken for this visit. Physical Exam   Lab Results:  CBC    Component Value Date/Time   WBC 4.0 07/17/2021 1135   RBC 4.75 07/17/2021 1135   HGB 14.3 07/17/2021 1135   HCT 43.6 07/17/2021 1135   PLT 214.0 07/17/2021 1135   MCV 91.8 07/17/2021 1135   MCH 29.8 07/24/2019 1320   MCHC 32.7 07/17/2021 1135   RDW 14.2 07/17/2021 1135   LYMPHSABS 2.1 07/17/2021 1135   MONOABS 0.2 07/17/2021 1135   EOSABS 0.1 07/17/2021 1135   BASOSABS 0.0 07/17/2021 1135    BMET    Component Value Date/Time   NA 135 07/17/2021 1135   K 4.0 07/17/2021 1135   CL 101 07/17/2021 1135   CO2 26 07/17/2021 1135   GLUCOSE 92 07/17/2021 1135   BUN 14 07/17/2021 1135   CREATININE 1.15 07/17/2021 1135   CALCIUM 9.6 07/17/2021 1135   GFRNONAA >60 07/24/2019 1320   GFRAA >60 07/24/2019 1320    BNP No results found for: "BNP"  ProBNP  No results found for: "PROBNP"  Imaging: No results found.   Assessment & Plan:   No problem-specific Assessment & Plan notes found for this encounter.     Martyn Ehrich, NP 10/13/2021

## 2021-10-14 ENCOUNTER — Institutional Professional Consult (permissible substitution): Payer: Commercial Managed Care - HMO | Admitting: Primary Care

## 2021-10-21 ENCOUNTER — Institutional Professional Consult (permissible substitution): Payer: Commercial Managed Care - HMO | Admitting: Primary Care

## 2021-10-23 ENCOUNTER — Encounter: Payer: Self-pay | Admitting: Primary Care

## 2021-10-23 ENCOUNTER — Ambulatory Visit (INDEPENDENT_AMBULATORY_CARE_PROVIDER_SITE_OTHER): Payer: Commercial Managed Care - HMO | Admitting: Primary Care

## 2021-10-23 VITALS — BP 116/83 | HR 67 | Temp 97.3°F | Ht 63.5 in | Wt 251.8 lb

## 2021-10-23 DIAGNOSIS — J189 Pneumonia, unspecified organism: Secondary | ICD-10-CM | POA: Insufficient documentation

## 2021-10-23 DIAGNOSIS — G4733 Obstructive sleep apnea (adult) (pediatric): Secondary | ICD-10-CM | POA: Diagnosis not present

## 2021-10-23 NOTE — Patient Instructions (Addendum)
Sleep apnea is defined as period of 10 seconds or longer when you stop breathing at night. This can happen multiple times a night. Dx sleep apnea is when this occurs more than 5 times an hour.    Mild OSA 5-15 apneic events an hour Moderate OSA 15-30 apneic events an hour Severe OSA > 30 apneic events an hour   Untreated sleep apnea puts you at higher risk for cardiac arrhythmias, pulmonary HTN, stroke and diabetes   Treatment options include weight loss, side sleeping position, oral appliance, CPAP therapy or referral to ENT for possible surgical options    Recommendations: Focus on side sleeping position or elevate head with wedge pillow 30 degrees Work on weight loss efforts if able  Do not drive if experiencing excessive daytime sleepiness of fatigue  Take mucinex '600mg'$  tablet twice daily x 5 days    Orders: New CPAP machine 5-20cm h20; renew supplies  CXR in 4 weeks    Follow-up: Come in for CXR in 4 weeks, no apt needed (avoid lunch time between 12:30-1:30pm) 31-90 days after getting new CPAP for compliance check (can be virtual with Beth NP)  CPAP and BIPAP Information CPAP and BIPAP are methods that use air pressure to keep your airways open and to help you breathe well. CPAP and BIPAP use different amounts of pressure. Your health care provider will tell you whether CPAP or BIPAP would be more helpful for you. CPAP stands for "continuous positive airway pressure." With CPAP, the amount of pressure stays the same while you breathe in (inhale) and out (exhale). BIPAP stands for "bi-level positive airway pressure." With BIPAP, the amount of pressure will be higher when you inhale and lower when you exhale. This allows you to take larger breaths. CPAP or BIPAP may be used in the hospital, or your health care provider may want you to use it at home. You may need to have a sleep study before your health care provider can order a machine for you to use at home. What are the  advantages? CPAP or BIPAP can be helpful if you have: Sleep apnea. Chronic obstructive pulmonary disease (COPD). Heart failure. Medical conditions that cause muscle weakness, including muscular dystrophy or amyotrophic lateral sclerosis (ALS). Other problems that cause breathing to be shallow, weak, abnormal, or difficult. CPAP and BIPAP are most commonly used for obstructive sleep apnea (OSA) to keep the airways from collapsing when the muscles relax during sleep. What are the risks? Generally, this is a safe treatment. However, problems may occur, including: Irritated skin or skin sores if the mask does not fit properly. Dry or stuffy nose or nosebleeds. Dry mouth. Feeling gassy or bloated. Sinus or lung infection if the equipment is not cleaned properly. When should CPAP or BIPAP be used? In most cases, the mask only needs to be worn during sleep. Generally, the mask needs to be worn throughout the night and during any daytime naps. People with certain medical conditions may also need to wear the mask at other times, such as when they are awake. Follow instructions from your health care provider about when to use the machine. What happens during CPAP or BIPAP?  Both CPAP and BIPAP are provided by a small machine with a flexible plastic tube that attaches to a plastic mask that you wear. Air is blown through the mask into your nose or mouth. The amount of pressure that is used to blow the air can be adjusted on the machine. Your health care  provider will set the pressure setting and help you find the best mask for you. Tips for using the mask Because the mask needs to be snug, some people feel trapped or closed-in (claustrophobic) when first using the mask. If you feel this way, you may need to get used to the mask. One way to do this is to hold the mask loosely over your nose or mouth and then gradually apply the mask more snugly. You can also gradually increase the amount of time that you  use the mask. Masks are available in various types and sizes. If your mask does not fit well, talk with your health care provider about getting a different one. Some common types of masks include: Full face masks, which fit over the mouth and nose. Nasal masks, which fit over the nose. Nasal pillow or prong masks, which fit into the nostrils. If you are using a mask that fits over your nose and you tend to breathe through your mouth, a chin strap may be applied to help keep your mouth closed. Use a skin barrier to protect your skin as told by your health care provider. Some CPAP and BIPAP machines have alarms that may sound if the mask comes off or develops a leak. If you have trouble with the mask, it is very important that you talk with your health care provider about finding a way to make the mask easier to tolerate. Do not stop using the mask. There could be a negative impact on your health if you stop using the mask. Tips for using the machine Place your CPAP or BIPAP machine on a secure table or stand near an electrical outlet. Know where the on/off switch is on the machine. Follow instructions from your health care provider about how to set the pressure on your machine and when you should use it. Do not eat or drink while the CPAP or BIPAP machine is on. Food or fluids could get pushed into your lungs by the pressure of the CPAP or BIPAP. For home use, CPAP and BIPAP machines can be rented or purchased through home health care companies. Many different brands of machines are available. Renting a machine before purchasing may help you find out which particular machine works well for you. Your health insurance company may also decide which machine you may get. Keep the CPAP or BIPAP machine and attachments clean. Ask your health care provider for specific instructions. Check the humidifier if you have a dry stuffy nose or nosebleeds. Make sure it is working correctly. Follow these instructions  at home: Take over-the-counter and prescription medicines only as told by your health care provider. Ask if you can take sinus medicine if your sinuses are blocked. Do not use any products that contain nicotine or tobacco. These products include cigarettes, chewing tobacco, and vaping devices, such as e-cigarettes. If you need help quitting, ask your health care provider. Keep all follow-up visits. This is important. Contact a health care provider if: You have redness or pressure sores on your head, face, mouth, or nose from the mask or head gear. You have trouble using the CPAP or BIPAP machine. You cannot tolerate wearing the CPAP or BIPAP mask. Someone tells you that you snore even when wearing your CPAP or BIPAP. Get help right away if: You have trouble breathing. You feel confused. Summary CPAP and BIPAP are methods that use air pressure to keep your airways open and to help you breathe well. If you have trouble  with the mask, it is very important that you talk with your health care provider about finding a way to make the mask easier to tolerate. Do not stop using the mask. There could be a negative impact to your health if you stop using the mask. Follow instructions from your health care provider about when to use the machine. This information is not intended to replace advice given to you by your health care provider. Make sure you discuss any questions you have with your health care provider. Document Revised: 08/14/2020 Document Reviewed: 12/15/2019 Elsevier Patient Education  [place].

## 2021-10-23 NOTE — Addendum Note (Signed)
Addended by: Irine Seal B on: 10/23/2021 11:01 AM   Modules accepted: Orders

## 2021-10-23 NOTE — Assessment & Plan Note (Addendum)
-   Dx with presumed pneumonia in [place] on Sunday 10/19/21. At that time she was having symptoms of fever, chest heaviness and cough. Tx with Augmentin and Azithromycin. She is on last day of antibiotics. She is feeling better. Lungs were clear on exam and VSS. Needs CXR in 4 weeks. Call if symptoms worsen or return.

## 2021-10-23 NOTE — Assessment & Plan Note (Addendum)
-   Longstanding hx sleep apnea, she has been on CPAP for >15 years. Most recent sleep study was in 2014, AHI 13.7 an hour with SPO2 low 85%. She remains complaint with CPAP and reports significant benefit from use. She has been getting CPAP supplies online. Uses nasal pillow mask. Needs new CPAP machine. Current pressure 5-20cm h20; Residual AHI 0.3. No changes needed. FU in 31-90 days after getting new machine for compliance check.

## 2021-10-23 NOTE — Progress Notes (Signed)
$'@Patient'V$  ID: Carol Conner, female    DOB: May 19, 1960, 61 y.o.   MRN: [id number]  Chief Complaint  Patient presents with   Consult    Referring provider: Plotnikov, Evie Lacks, MD  HPI: 61 year old female.  Past medical history significant for obstructive sleep apnea, fatty liver, diabetes, ovarian cyst, obesity.   10/23/2021 Patient presents today for new sleep consult.  She was a former patient of Dr. Halford Chessman, last seen for original consult on 09/29/2012.  She had a home sleep study on 10/03/2012 that showed mild obstructive sleep apnea, AHI 13.7 an hour with SPO2 low 85%. She was started on auto CPAP 5 to 20 cm H2O.  Doing well on CPAP. She has been on CPAP for 15 years. She reports benefit from use She is 100% compliant with use. No issues. She uses nasal pillow mask. No daytime sleepiness.  She is not currently establish with any DME company, she is getting her supplies online.   Dx with presumed pneumonia in [place] on Sunday 10/19/21. At that time she was having symptoms of fever, chest heaviness and cough. Tx with Augmentin and azithromycin. She is on last day of antibiotics. She is feeling better.   Airview download 07/25/21-10/22/21 86/90 days used; 93% > 4 hours  Averaghe usage 6 hours 14 mins Pressure 5-20cm h20 (12.6cm h20- 95%) Airleaks 0.1L/min (95%) AHI 0.3   Sleep questionnaire Symptoms-   Sleep apnea Prior sleep study- 2014 Bedtime- 11:30pm Time to fall asleep- 20 min Nocturnal awakenings- once Out of bed/start of day- 7:30-8:30am Weight changes- +/- 5 lbs Do you operate heavy machinery- no Do you currently wear CPAP- yes; 5-20cm h20 Do you current wear oxygen- no Epworth- 7    No Known Allergies  Immunization History  Administered Date(s) Administered   Hepatitis B, adult 04/18/2021   PFIZER(Purple Top)SARS-COV-2 Vaccination 03/31/2019, 04/21/2019, 12/13/2019   Zoster Recombinat (Shingrix) 06/26/2021   Zoster, Live 04/18/2021    Past Medical History:   Diagnosis Date   Anxiety    Genital herpes    Heme positive stool    Sleep apnea    uses c-pap   Uterine leiomyoma     Tobacco History: Social History   Tobacco Use  Smoking Status Never   Passive exposure: Past  Smokeless Tobacco Never   Counseling given: Not Answered   Outpatient Medications Prior to Visit  Medication Sig Dispense Refill   Cholecalciferol (VITAMIN D3) 50 MCG (2000 UT) capsule Take 1 capsule (2,000 Units total) by mouth daily. 100 capsule 3   Dulaglutide (TRULICITY) 1.5 QQ/5.9DG SOPN INJECT 1.5 MG SUBCUTANEOUSLY  ONCE A WEEK 4 mL 1   magnesium 30 MG tablet Take 500 mg by mouth at bedtime.      OIL OF OREGANO PO Take 2 each by mouth 3 times/day as needed-between meals & bedtime.     Omega-3 Fatty Acids (FISH OIL) 1000 MG CAPS Take by mouth daily.     amoxicillin-clavulanate (AUGMENTIN) 875-125 MG tablet Take 1 tablet by mouth 2 (two) times daily.     azithromycin (ZITHROMAX) 250 MG tablet Take by mouth.     buPROPion (WELLBUTRIN XL) 150 MG 24 hr tablet Take 150 mg by mouth every morning.     Cranberry-Milk Thistle (LIVER & KIDNEY CLEANSER PO) Take by mouth.     Semaglutide, 1 MG/DOSE, 4 MG/3ML SOPN Inject 1 mg into the skin once a week. 3 mL 0   Semaglutide, 2 MG/DOSE, 8 MG/3ML SOPN Inject 2 mg as  directed once a week. 3 mL 5   No facility-administered medications prior to visit.   Review of Systems  Review of Systems  Constitutional: Negative.   HENT: Negative.    Respiratory: Negative.    Cardiovascular: Negative.    Physical Exam  BP 116/83 (BP Location: Left Arm, Patient Position: Sitting, Cuff Size: Large)   Pulse 67   Temp (!) 97.3 F (36.3 C) (Oral)   Ht 5' 3.5" (1.613 m)   Wt 251 lb 12.8 oz (114.2 kg)   SpO2 95%   BMI 43.90 kg/m  Physical Exam Constitutional:      Appearance: Normal appearance.  HENT:     Head: Normocephalic and atraumatic.     Mouth/Throat:     Mouth: Mucous membranes are moist.     Pharynx: Oropharynx is  clear.  Cardiovascular:     Rate and Rhythm: Normal rate and regular rhythm.  Pulmonary:     Effort: Pulmonary effort is normal. No respiratory distress.     Breath sounds: Normal breath sounds. No wheezing, rhonchi or rales.  Musculoskeletal:        General: Normal range of motion.  Skin:    General: Skin is warm and dry.  Neurological:     General: No focal deficit present.     Mental Status: She is alert and oriented to person, place, and time. Mental status is at baseline.  Psychiatric:        Mood and Affect: Mood normal.        Behavior: Behavior normal.        Thought Content: Thought content normal.        Judgment: Judgment normal.      Lab Results:  CBC    Component Value Date/Time   WBC 4.0 07/17/2021 1135   RBC 4.75 07/17/2021 1135   HGB 14.3 07/17/2021 1135   HCT 43.6 07/17/2021 1135   PLT 214.0 07/17/2021 1135   MCV 91.8 07/17/2021 1135   MCH 29.8 07/24/2019 1320   MCHC 32.7 07/17/2021 1135   RDW 14.2 07/17/2021 1135   LYMPHSABS 2.1 07/17/2021 1135   MONOABS 0.2 07/17/2021 1135   EOSABS 0.1 07/17/2021 1135   BASOSABS 0.0 07/17/2021 1135    BMET    Component Value Date/Time   NA 135 07/17/2021 1135   K 4.0 07/17/2021 1135   CL 101 07/17/2021 1135   CO2 26 07/17/2021 1135   GLUCOSE 92 07/17/2021 1135   BUN 14 07/17/2021 1135   CREATININE 1.15 07/17/2021 1135   CALCIUM 9.6 07/17/2021 1135   GFRNONAA >60 07/24/2019 1320   GFRAA >60 07/24/2019 1320    BNP No results found for: "BNP"  ProBNP No results found for: "PROBNP"  Imaging: No results found.   Assessment & Plan:   OBSTRUCTIVE SLEEP APNEA - Longstanding hx sleep apnea, she has been on CPAP for >15 years. Most recent sleep study was in 2014, AHI 13.7 an hour with SPO2 low 85%. She remains complaint with CPAP and reports significant benefit from use. She has been getting CPAP supplies online. Uses nasal pillow mask. Needs new CPAP machine. Current pressure 5-20cm h20; Residual AHI 0.3.  No changes needed. FU in 31-90 days after getting new machine for compliance check.   Pneumonia - Dx with presumed pneumonia in [place] on Sunday 10/19/21. At that time she was having symptoms of fever, chest heaviness and cough. Tx with Augmentin and Azithromycin. She is on last day of antibiotics. She is feeling better.  Lungs were clear on exam and VSS. Needs CXR in 4 weeks. Call if symptoms worsen or return.      Martyn Ehrich, NP 10/23/2021

## 2021-10-23 NOTE — Progress Notes (Signed)
Reviewed and agree with assessment/plan.   Chesley Mires, MD [facility] Pulmonary/Critical Care 10/23/2021, 11:25 AM Pager:  [phone]

## 2021-10-27 ENCOUNTER — Encounter: Payer: Self-pay | Admitting: Internal Medicine

## 2021-10-27 NOTE — Telephone Encounter (Signed)
Dr. Desai, please see mychart messages sent by pt and advise. ?

## 2021-10-27 NOTE — Telephone Encounter (Signed)
Ok to schedule sick visit with app

## 2021-10-28 ENCOUNTER — Other Ambulatory Visit: Payer: Self-pay | Admitting: Internal Medicine

## 2021-10-28 ENCOUNTER — Ambulatory Visit (INDEPENDENT_AMBULATORY_CARE_PROVIDER_SITE_OTHER): Payer: Commercial Managed Care - HMO | Admitting: Internal Medicine

## 2021-10-28 ENCOUNTER — Encounter: Payer: Self-pay | Admitting: Internal Medicine

## 2021-10-28 VITALS — BP 120/70 | HR 78 | Ht 63.0 in | Wt 249.2 lb

## 2021-10-28 DIAGNOSIS — R0789 Other chest pain: Secondary | ICD-10-CM | POA: Diagnosis not present

## 2021-10-28 DIAGNOSIS — R058 Other specified cough: Secondary | ICD-10-CM

## 2021-10-28 NOTE — Patient Instructions (Signed)
Follow up in 6-8 weeks if cough not improved  Can try over the counter cold and flu medications to help with cough.   Chlorpheniramine is over the counter and can also help with cough at night time (it is often in the night time cold/flu medicines already.)  Can try nasal saline for nasal congestion and drainage.   For burning in chest if not resolved with above try heart burn medicine and let us know how it helps.

## 2021-10-28 NOTE — Progress Notes (Signed)
Carol Conner    [id number]    02-09-1960  Primary Care Physician:Plotnikov, Evie Lacks, MD Date of Appointment: 10/28/2021 Established Patient Visit  Chief complaint:   Chief Complaint  Patient presents with   Follow-up    Burning across chest.     HPI: Carol Conner is a 61 y.o. woman with OSA on CPAP. Has seen BW before in clinic  Interval Updates: 10 days ago was diagnosed with pneumonia while traveling. Treated with abx when she was traveling in [place]. Finished abx 5 days ago and was feeling better. Now here with cough, nonproductive.   No longer having fevers, chills. Appetite is ok. She is having a burning sensation across her chest. Worse when she lays down at night, sometimes just with breathing. Still coughing. Denies difficulty with ADLs.  Appetite is ok.   Never had asthma. Does not take any breathing  Denies heart burn or reflux.   I have reviewed the patient's family social and past medical history and updated as appropriate.   Past Medical History:  Diagnosis Date   Anxiety    Genital herpes    Heme positive stool    Sleep apnea    uses c-pap   Uterine leiomyoma     Past Surgical History:  Procedure Laterality Date   abd infection     past hysterectomy   ABDOMINAL HYSTERECTOMY     APPENDECTOMY     BILATERAL OOPHORECTOMY     BREAST REDUCTION SURGERY     COLONOSCOPY      Family History  Problem Relation Age of Onset   Breast cancer Mother    Kidney disease Mother    Cancer Father    Prostate cancer Father    Liver cancer Father    Pulmonary embolism Sister    Colon cancer Neg Hx     Social History   Occupational History   Not on file  Tobacco Use   Smoking status: Never    Passive exposure: Past   Smokeless tobacco: Never  Vaping Use   Vaping Use: Never used  Substance and Sexual Activity   Alcohol use: No   Drug use: No   Sexual activity: Yes    Birth control/protection: None     Physical Exam: Blood  pressure 120/70, pulse 78, height '5\' 3"'$  (1.6 m), weight 249 lb 3.2 oz (113 kg), SpO2 98 %.  Gen:      No acute distress ENT:  mild cobblestoning no nasal polyps, mucus membranes moist Lungs:    No increased respiratory effort, symmetric chest wall excursion, clear to auscultation bilaterally, no wheezes or crackles CV:         Regular rate and rhythm; no murmurs, rubs, or gallops.  No pedal edema   Data Reviewed: Imaging: I have personally reviewed the   PFTs:      No data to display         I have personally reviewed the patient's PFTs and   Labs:  Immunization status: Immunization History  Administered Date(s) Administered   Hepatitis B, adult 04/18/2021   PFIZER(Purple Top)SARS-COV-2 Vaccination 03/31/2019, 04/21/2019, 12/13/2019   Zoster Recombinat (Shingrix) 06/26/2021   Zoster, Live 04/18/2021    External Records Personally Reviewed: pulmonary  Assessment:  Post viral cough syndrome, recovering from recent pneumonia. Burning in chest - likely GERD  Plan/Recommendations: Follow up in 6-8 weeks if cough not improved  Can try over the counter cold and flu  medications to help with cough.   Chlorpheniramine is over the counter and can also help with cough at night time (it is often in the night time cold/flu medicines already.)  Can try nasal saline for nasal congestion and drainage.   For burning in chest if not resolved with above try heart burn medicine and let us know how it helps.    Return to Care: Follow up for OSA compliance per last visit.   Lenice Llamas, MD Pulmonary and [place]

## 2021-10-29 ENCOUNTER — Other Ambulatory Visit: Payer: Self-pay

## 2021-10-30 MED ORDER — TRULICITY 1.5 MG/0.5ML ~~LOC~~ SOAJ: 1.5000 mg | SUBCUTANEOUS | 1 refills | Status: DC

## 2021-12-19 ENCOUNTER — Other Ambulatory Visit: Payer: Self-pay | Admitting: Internal Medicine

## 2022-01-18 ENCOUNTER — Other Ambulatory Visit: Payer: Self-pay | Admitting: Internal Medicine

## 2022-01-29 ENCOUNTER — Encounter: Payer: Self-pay | Admitting: Internal Medicine

## 2022-02-25 ENCOUNTER — Other Ambulatory Visit: Payer: Self-pay | Admitting: Internal Medicine

## 2022-02-25 MED ORDER — TRULICITY 3 MG/0.5ML ~~LOC~~ SOAJ: 3.0000 mg | SUBCUTANEOUS | 1 refills | Status: DC

## 2022-03-31 ENCOUNTER — Ambulatory Visit: Payer: Commercial Managed Care - HMO | Admitting: Internal Medicine

## 2022-04-06 NOTE — Telephone Encounter (Signed)
Patient Advocate Encounter   Received notification from [employer] that prior authorization for Trulicity is required.   PA submitted on 04/06/2022 Key [id number] [employer] Status is pending

## 2022-04-09 ENCOUNTER — Encounter: Payer: Self-pay | Admitting: Internal Medicine

## 2022-04-09 ENCOUNTER — Ambulatory Visit (INDEPENDENT_AMBULATORY_CARE_PROVIDER_SITE_OTHER): Payer: Commercial Managed Care - HMO | Admitting: Internal Medicine

## 2022-04-09 VITALS — BP 118/86 | HR 71 | Temp 98.3°F | Ht 63.0 in | Wt 249.0 lb

## 2022-04-09 DIAGNOSIS — K760 Fatty (change of) liver, not elsewhere classified: Secondary | ICD-10-CM

## 2022-04-09 DIAGNOSIS — E118 Type 2 diabetes mellitus with unspecified complications: Secondary | ICD-10-CM | POA: Diagnosis not present

## 2022-04-09 LAB — COMPREHENSIVE METABOLIC PANEL
ALT: 83 U/L — ABNORMAL HIGH (ref 0–35)
AST: 68 U/L — ABNORMAL HIGH (ref 0–37)
Albumin: 3.9 g/dL (ref 3.5–5.2)
Alkaline Phosphatase: 82 U/L (ref 39–117)
BUN: 15 mg/dL (ref 6–23)
CO2: 29 mEq/L (ref 19–32)
Calcium: 9.5 mg/dL (ref 8.4–10.5)
Chloride: 104 mEq/L (ref 96–112)
Creatinine, Ser: 1.09 mg/dL (ref 0.40–1.20)
GFR: 55.59 mL/min — ABNORMAL LOW (ref >60.00)
Glucose, Bld: 97 mg/dL (ref 70–99)
Potassium: 4.3 mEq/L (ref 3.5–5.1)
Sodium: 140 mEq/L (ref 135–145)
Total Bilirubin: 0.6 mg/dL (ref 0.2–1.2)
Total Protein: 7.4 g/dL (ref 6.0–8.3)

## 2022-04-09 LAB — CBC
HCT: 41.1 % (ref 36.0–46.0)
Hemoglobin: 13.8 g/dL (ref 12.0–15.0)
MCHC: 33.5 g/dL (ref 30.0–36.0)
MCV: 91.5 fl (ref 78.0–100.0)
Platelets: 218 10*3/uL (ref 150.0–400.0)
RBC: 4.49 Mil/uL (ref 3.87–5.11)
RDW: 14.7 % (ref 11.5–15.5)
WBC: 4.1 10*3/uL (ref 4.0–10.5)

## 2022-04-09 LAB — LIPID PANEL
Cholesterol: 195 mg/dL (ref 0–200)
HDL: 68.7 mg/dL (ref >39.00)
LDL Cholesterol: 112 mg/dL — ABNORMAL HIGH (ref 0–99)
NonHDL: 125.91
Total CHOL/HDL Ratio: 3
Triglycerides: 68 mg/dL (ref 0.0–149.0)
VLDL: 13.6 mg/dL (ref 0.0–40.0)

## 2022-04-09 LAB — MICROALBUMIN / CREATININE URINE RATIO
Creatinine,U: 199.1 mg/dL
Microalb Creat Ratio: 0.4 mg/g (ref 0.0–30.0)
Microalb, Ur: <0.7 mg/dL (ref 0.0–1.9)

## 2022-04-09 LAB — HEMOGLOBIN A1C: Hgb A1c MFr Bld: 6.2 % (ref 4.6–6.5)

## 2022-04-09 MED ORDER — TRULICITY 1.5 MG/0.5ML ~~LOC~~ SOAJ: 1.5000 mg | SUBCUTANEOUS | 0 refills | Status: DC

## 2022-04-09 NOTE — Progress Notes (Signed)
   Subjective:   Patient ID: Carol Conner, female    DOB: 11-10-1960, 62 y.o.   MRN: [id number]  HPI The patient is a 62 YO female coming in for follow up.   Review of Systems  Constitutional:  Positive for unexpected weight change.  HENT: Negative.    Eyes: Negative.   Respiratory:  Negative for cough, chest tightness and shortness of breath.   Cardiovascular:  Negative for chest pain, palpitations and leg swelling.  Gastrointestinal:  Negative for abdominal distention, abdominal pain, constipation, diarrhea, nausea and vomiting.  Musculoskeletal: Negative.   Skin: Negative.   Neurological: Negative.   Psychiatric/Behavioral: Negative.      Objective:  Physical Exam Constitutional:      Appearance: She is well-developed. She is obese.  HENT:     Head: Normocephalic and atraumatic.  Cardiovascular:     Rate and Rhythm: Normal rate and regular rhythm.  Pulmonary:     Effort: Pulmonary effort is normal. No respiratory distress.     Breath sounds: Normal breath sounds. No wheezing or rales.  Abdominal:     General: Bowel sounds are normal. There is no distension.     Palpations: Abdomen is soft.     Tenderness: There is no abdominal tenderness. There is no rebound.  Musculoskeletal:     Cervical back: Normal range of motion.  Skin:    General: Skin is warm and dry.  Neurological:     Mental Status: She is alert and oriented to person, place, and time.     Coordination: Coordination normal.     Vitals:   04/09/22 1044  BP: 118/86  Pulse: 71  Temp: 98.3 F (36.8 C)  TempSrc: Oral  SpO2: 99%  Weight: 249 lb (112.9 kg)  Height: 5\' 3"  (1.6 m)    Assessment & Plan:

## 2022-04-09 NOTE — Patient Instructions (Addendum)
We will check the labs today. 

## 2022-04-10 DIAGNOSIS — E118 Type 2 diabetes mellitus with unspecified complications: Secondary | ICD-10-CM | POA: Insufficient documentation

## 2022-04-10 NOTE — Assessment & Plan Note (Signed)
We had increased her trulicity to 3 mg weekly but insurance has not responded to PA at this time. Refill 1.5 mg weekly in case this is not approved. Checking HgA1c and microalbumin to creatinine ratio and CMP and lipid panel. Adjust as needed. Not on statin or ARB/ACE-I

## 2022-04-10 NOTE — Telephone Encounter (Signed)
Patient stated that she was denied of this medication when seen yesterday 04/09/2022

## 2022-04-21 ENCOUNTER — Telehealth: Payer: Self-pay

## 2022-04-21 NOTE — Telephone Encounter (Signed)
Pharmacy Patient Advocate Encounter  Received notification from [facility] that the request for prior authorization for Trulicity 3mg /0.8ml has been denied due to no indication the patient has met both of the following: A) Individual will continue  maximally tolerated metformin therapy, if not contraindicated per FDA label, intolerant, or otherwise  not a candidate; and B) Documentation of one of the following: 1. Unable to achieve goal HbA1C  despite metformin or metformin-containing regimen (meglitinides, sulfonylureas, or  thiazolidinediones) at greater than or equal to 1,500 mg per day; 2. Intolerance to metformin 1,500  mg per day despite appropriate dose titration duration (for example, period of 8-12 weeks); 3.  Contraindication to metformin per FDA label (for example, acute/chronic metabolic acidosis, severe renal dysfunction); 4. Not a candidate for metformin (for example, hepatic impairment, moderate renal dysfunction, unstable heart failure, individual is using an agent for a non-diabetic FDAapproved indication); 5. Initial metformin combination therapy is clinically appropriate for elevated  HbA1C (for example; greater than 1.5% above goal); or 6. Initial metformin combination therapy is  clinically appropriate in an individual with co-morbid conditions (such as ASCVD, heart failure, or  CKD)..    Please be advised we currently do not have a Pharmacist to review denials, therefore you will need to process appeals accordingly as needed. Thanks for your support at this time.   You may call [phone] or fax [phone], to appeal.   Denial letter attached to chart.

## 2022-04-30 ENCOUNTER — Ambulatory Visit: Payer: Commercial Managed Care - HMO | Admitting: Internal Medicine

## 2022-04-30 ENCOUNTER — Encounter: Payer: Self-pay | Admitting: Internal Medicine

## 2022-04-30 VITALS — BP 110/60 | HR 83 | Temp 98.6°F | Ht 63.0 in | Wt 242.0 lb

## 2022-04-30 DIAGNOSIS — R944 Abnormal results of kidney function studies: Secondary | ICD-10-CM | POA: Diagnosis not present

## 2022-04-30 DIAGNOSIS — E785 Hyperlipidemia, unspecified: Secondary | ICD-10-CM | POA: Diagnosis not present

## 2022-04-30 DIAGNOSIS — Z7985 Long-term (current) use of injectable non-insulin antidiabetic drugs: Secondary | ICD-10-CM

## 2022-04-30 DIAGNOSIS — E118 Type 2 diabetes mellitus with unspecified complications: Secondary | ICD-10-CM

## 2022-04-30 DIAGNOSIS — Z7984 Long term (current) use of oral hypoglycemic drugs: Secondary | ICD-10-CM

## 2022-04-30 DIAGNOSIS — R232 Flushing: Secondary | ICD-10-CM

## 2022-04-30 MED ORDER — TRULICITY 3 MG/0.5ML ~~LOC~~ SOAJ: 3.0000 mg | SUBCUTANEOUS | 1 refills | Status: DC

## 2022-04-30 MED ORDER — METFORMIN HCL 500 MG PO TABS
500.0000 mg | ORAL_TABLET | Freq: Every day | ORAL | 11 refills | Status: DC
Start: 2022-04-30 — End: 2022-08-17

## 2022-04-30 NOTE — Assessment & Plan Note (Signed)
Discussed [place]

## 2022-04-30 NOTE — Assessment & Plan Note (Signed)
Hydrate better 

## 2022-04-30 NOTE — Assessment & Plan Note (Signed)
Wt Readings from Last 3 Encounters:  04/30/22 242 lb (109.8 kg)  04/09/22 249 lb (112.9 kg)  10/28/21 249 lb 3.2 oz (113 kg)

## 2022-04-30 NOTE — Assessment & Plan Note (Signed)
On Trulicity - increase the dose Added Metformin

## 2022-04-30 NOTE — Progress Notes (Signed)
Subjective:  Patient ID: Carol Conner, female    DOB: May 07, 1960  Age: 62 y.o. MRN: [id number]  CC: Follow-up (OVERALL HEALTH CHECK UP AND DISCUSS WEIGHT LOSS MEDS)   HPI Carol Conner presents for hot flashes, DM, decreased GFR  Outpatient Medications Prior to Visit  Medication Sig Dispense Refill   buPROPion (WELLBUTRIN XL) 150 MG 24 hr tablet Take 150 mg by mouth every morning.     Cholecalciferol (VITAMIN D3) 50 MCG (2000 UT) capsule Take 1 capsule (2,000 Units total) by mouth daily. 100 capsule 3   magnesium 30 MG tablet Take 500 mg by mouth at bedtime.      OIL OF OREGANO PO Take 2 each by mouth 3 times/day as needed-between meals & bedtime.     Omega-3 Fatty Acids (FISH OIL) 1000 MG CAPS Take by mouth daily.     Dulaglutide (TRULICITY) 1.5 MG/0.5ML SOPN Inject 1.5 mg into the skin once a week. 2 mL 0   Dulaglutide (TRULICITY) 3 MG/0.5ML SOPN Inject 3 mg as directed once a week. 6 mL 1   valACYclovir (VALTREX) 500 MG tablet Take 500 mg by mouth daily.     No facility-administered medications prior to visit.    ROS: Review of Systems  Constitutional:  Negative for activity change, appetite change, chills, fatigue and unexpected weight change.  HENT:  Negative for congestion, mouth sores and sinus pressure.   Eyes:  Negative for visual disturbance.  Respiratory:  Negative for cough and chest tightness.   Gastrointestinal:  Negative for abdominal pain and nausea.  Genitourinary:  Negative for difficulty urinating, frequency and vaginal pain.  Musculoskeletal:  Negative for back pain and gait problem.  Skin:  Negative for pallor and rash.  Neurological:  Negative for dizziness, tremors, weakness, numbness and headaches.  Psychiatric/Behavioral:  Negative for confusion and sleep disturbance.     Objective:  BP 110/60 (BP Location: Left Arm, Patient Position: Sitting, Cuff Size: Normal)   Pulse 83   Temp 98.6 F (37 C) (Oral)   Ht 5\' 3"  (1.6 m)   Wt 242 lb (109.8 kg)    BMI 42.87 kg/m   BP Readings from Last 3 Encounters:  05/20/22 112/80  04/30/22 110/60  04/09/22 118/86    Wt Readings from Last 3 Encounters:  05/20/22 252 lb (114.3 kg)  04/30/22 242 lb (109.8 kg)  04/09/22 249 lb (112.9 kg)    Physical Exam Constitutional:      General: She is not in acute distress.    Appearance: She is well-developed.  HENT:     Head: Normocephalic.     Right Ear: External ear normal.     Left Ear: External ear normal.     Nose: Nose normal.  Eyes:     General:        Right eye: No discharge.        Left eye: No discharge.     Conjunctiva/sclera: Conjunctivae normal.     Pupils: Pupils are equal, round, and reactive to light.  Neck:     Thyroid: No thyromegaly.     Vascular: No JVD.     Trachea: No tracheal deviation.  Cardiovascular:     Rate and Rhythm: Normal rate and regular rhythm.     Heart sounds: Normal heart sounds.  Pulmonary:     Effort: No respiratory distress.     Breath sounds: No stridor. No wheezing.  Abdominal:     General: Bowel sounds are normal. There is no  distension.     Palpations: Abdomen is soft. There is no mass.     Tenderness: There is no abdominal tenderness. There is no guarding or rebound.  Musculoskeletal:        General: No tenderness.     Cervical back: Normal range of motion and neck supple. No rigidity.  Lymphadenopathy:     Cervical: No cervical adenopathy.  Skin:    Findings: No erythema or rash.  Neurological:     Cranial Nerves: No cranial nerve deficit.     Motor: No abnormal muscle tone.     Coordination: Coordination normal.     Deep Tendon Reflexes: Reflexes normal.  Psychiatric:        Behavior: Behavior normal.        Thought Content: Thought content normal.        Judgment: Judgment normal.     Lab Results  Component Value Date   WBC 4.1 04/09/2022   HGB 13.8 04/09/2022   HCT 41.1 04/09/2022   PLT 218.0 04/09/2022   GLUCOSE 97 04/09/2022   CHOL 195 04/09/2022   TRIG 68.0  04/09/2022   HDL 68.70 04/09/2022   LDLCALC 112 (H) 04/09/2022   ALT 83 (H) 04/09/2022   AST 68 (H) 04/09/2022   NA 140 04/09/2022   K 4.3 04/09/2022   CL 104 04/09/2022   CREATININE 1.09 04/09/2022   BUN 15 04/09/2022   CO2 29 04/09/2022   TSH 1.54 04/18/2021   HGBA1C 6.2 04/09/2022   MICROALBUR <0.7 04/09/2022    US ABDOMEN LIMITED RUQ (LIVER/GB)  Result Date: 07/24/2021 CLINICAL DATA:  Right upper quadrant pain and history of gallbladder polyp EXAM: ULTRASOUND ABDOMEN LIMITED RIGHT UPPER QUADRANT COMPARISON:  02/12/2021 FINDINGS: Gallbladder: Gallbladder is well distended. No cholelithiasis is seen. A 9 mm echogenic focus is noted consistent with stable gallbladder polyp. No pericholecystic fluid is noted. Common bile duct: Diameter: 2.6 mm. Liver: Diffusely increased in echogenicity consistent with fatty infiltration. No focal mass is noted. Portal vein is patent on color Doppler imaging with normal direction of blood flow towards the liver. Other: None. IMPRESSION: Fatty liver. 9 mm gallbladder polyp stable from prior exam. 1.   ?6 mm: no further evaluation or follow up necessary. 2. 7-9 mm: yearly follow up with ultrasound to ensure no interval growth. 3. ?10 mm: surgical consultation for cholecystectomy, if no cholecystectomy, annual follow up is justified. Per consensus guidelines, follow-up is recommended in 1 year. This recommendation follows ACR consensus guidelines: White Paper of the ACR Incidental findings Committee II on Gallbladder and Biliary Findings. J Am Coll Radiol 2013:;10:953-956. Electronically Signed   By: Alcide Clever M.D.   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    On Trulicity - increase the dose Added Metformin      Relevant Medications   Dulaglutide (TRULICITY) 3 MG/0.5ML SOPN   metFORMIN (GLUCOPHAGE) 500 MG tablet   Other Relevant Orders   CT CARDIAC SCORING (SELF PAY ONLY)   Hemoglobin A1c   CBC with Differential/Platelet  Comprehensive metabolic panel   TSH   Urinalysis   Lipid panel   Decreased GFR    Hydrate better      Relevant Orders   Hemoglobin A1c   CBC with Differential/Platelet   Comprehensive metabolic panel   TSH   Urinalysis   Lipid panel   Hot flashes    Discussed Ve[place]      Other Visit Diagnoses     Dyslipidemia       Relevant Orders   CT CARDIAC SCORING (SELF PAY ONLY)         Meds ordered this encounter  Medications   Dulaglutide (TRULICITY) 3 MG/0.5ML SOPN    Sig: Inject 3 mg as directed once a week.    Dispense:  6 mL    Refill:  1   metFORMIN (GLUCOPHAGE) 500 MG tablet    Sig: Take 1 tablet (500 mg total) by mouth daily with breakfast.    Dispense:  30 tablet    Refill:  11      Follow-up: Return in about 3 months (around 07/30/2022) for a follow-up visit.  Sonda Primes, MD

## 2022-04-30 NOTE — Patient Instructions (Signed)
Ve[place] for hot flashes

## 2022-05-18 NOTE — Progress Notes (Unsigned)
05/20/2022 Carol Conner [id number] 04-08-1960  Referring provider: Tresa Garter, MD Primary GI doctor: Dr. Russella Dar  ASSESSMENT AND PLAN:  Hepatic steatosis Hepatocellular workup negative Had decreasing LFTs previously Recheck every 6 months Continue to work on weight loss.  Gallbladder polyp 9 mm gallbladder polyp, alk phos unremarkable, no right upper quadrant pain, nausea, vomiting.  Will schedule for repeat June/July  Morbid obesity (HCC) Body mass index is 43.94 kg/m.  -Patient has been advised to make an attempt to improve diet and exercise patterns to aid in weight loss. -Recommended diet heavy in fruits and veggies and low in animal meats, cheeses, and dairy products, appropriate calorie intake  Screen for colon cancer Patient denies any symptoms, not due until 03/2024. Will call if she has any issues in the meantime for sooner colonoscopy.    Patient Care Team: Plotnikov, Georgina Quint, MD as PCP - General Janalyn Harder, MD (Inactive) as Consulting Physician (Dermatology)  HISTORY OF PRESENT ILLNESS: 62 y.o. female with a past medical history of OSA, fatty liver, type 2 diabetes, morbid obesity, and others listed below presents for evaluation of LFTs.  03/20/2014 colonoscopy with Dr. Russella Dar, excellent prep, normal colonoscopy recall 10 years recall 03/2024  Patient's had minimally elevated LFTs since July 2021. For saw Dr. Russella Dar January, had negative hepatitis studies, has finished hepatitis B vaccinations with primary. Normal liver studies so far: Iron, ferritin. , hepatitis panel, alpha 1 antitrypsin, ceruloplasmin, mitochondrial antibody, Anti-smooth muscle antibody, TTG/IGA, ACE and TSH. ANA was weakly postiive with 1:40 and 1:80.  07/17/2021 office visit with myself for history of elevated LFTs  07/24/2021 right upper quadrant ultrasound showed stable gallbladder polyp of 9 mm and fatty infiltration of the liver.   Needs recall right upper quadrant  ultrasound 07/2022 Patient is diagnosed with diabetes and started on Trulicity. Her last AST was 68, ALT 83, alk phos 82, total bilirubin 0.6.   This is decreased from a year ago.  She has had increase in constipation with trulicity, could not get it for a while, back on it for last 2 weeks, she is on metformin.  Increased her magnesium and this improved. No blood in stool.  She denies nausea, vomiting, obesity.  Denies right upper quadrant abdominal pain.  Wt Readings from Last 5 Encounters:  05/20/22 252 lb (114.3 kg)  04/30/22 242 lb (109.8 kg)  04/09/22 249 lb (112.9 kg)  10/28/21 249 lb 3.2 oz (113 kg)  10/23/21 251 lb 12.8 oz (114.2 kg)     She  reports that she has never smoked. She has been exposed to tobacco smoke. She has never used smokeless tobacco. She reports that she does not drink alcohol and does not use drugs.  RELEVANT LABS AND IMAGING: CBC    Component Value Date/Time   WBC 4.1 04/09/2022 1130   RBC 4.49 04/09/2022 1130   HGB 13.8 04/09/2022 1130   HCT 41.1 04/09/2022 1130   PLT 218.0 04/09/2022 1130   MCV 91.5 04/09/2022 1130   MCH 29.8 07/24/2019 1320   MCHC 33.5 04/09/2022 1130   RDW 14.7 04/09/2022 1130   LYMPHSABS 2.1 07/17/2021 1135   MONOABS 0.2 07/17/2021 1135   EOSABS 0.1 07/17/2021 1135   BASOSABS 0.0 07/17/2021 1135   Recent Labs    07/17/21 1135 04/09/22 1130  HGB 14.3 13.8    CMP     Component Value Date/Time   NA 140 04/09/2022 1130   K 4.3 04/09/2022 1130   CL 104  04/09/2022 1130   CO2 29 04/09/2022 1130   GLUCOSE 97 04/09/2022 1130   BUN 15 04/09/2022 1130   CREATININE 1.09 04/09/2022 1130   CALCIUM 9.5 04/09/2022 1130   PROT 7.4 04/09/2022 1130   ALBUMIN 3.9 04/09/2022 1130   AST 68 (H) 04/09/2022 1130   ALT 83 (H) 04/09/2022 1130   ALKPHOS 82 04/09/2022 1130   BILITOT 0.6 04/09/2022 1130   GFRNONAA >60 07/24/2019 1320   GFRAA >60 07/24/2019 1320      Latest Ref Rng & Units 04/09/2022   11:30 AM 07/17/2021   11:35  AM 04/18/2021   10:07 AM  Hepatic Function  Total Protein 6.0 - 8.3 g/dL 7.4  7.9  7.7   Albumin 3.5 - 5.2 g/dL 3.9  4.3  4.1   AST 0 - 37 U/L 68  65  97   ALT 0 - 35 U/L 83  81  112   Alk Phosphatase 39 - 117 U/L 82  82  83   Total Bilirubin 0.2 - 1.2 mg/dL 0.6  0.9  0.6       Current Medications:   Current Outpatient Medications (Endocrine & Metabolic):    Dulaglutide (TRULICITY) 1.5 MG/0.5ML SOPN, Inject 1.5 mg into the skin once a week.   Dulaglutide (TRULICITY) 3 MG/0.5ML SOPN, Inject 3 mg as directed once a week.   metFORMIN (GLUCOPHAGE) 500 MG tablet, Take 1 tablet (500 mg total) by mouth daily with breakfast.      Current Outpatient Medications (Other):    buPROPion (WELLBUTRIN XL) 150 MG 24 hr tablet, Take 150 mg by mouth every morning.   Cholecalciferol (VITAMIN D3) 50 MCG (2000 UT) capsule, Take 1 capsule (2,000 Units total) by mouth daily.   magnesium 30 MG tablet, Take 500 mg by mouth at bedtime.    OIL OF OREGANO PO, Take 2 each by mouth 3 times/day as needed-between meals & bedtime.   Omega-3 Fatty Acids (FISH OIL) 1000 MG CAPS, Take by mouth daily.  Medical History:  Past Medical History:  Diagnosis Date   Anxiety    Genital herpes    Heme positive stool    Sleep apnea    uses c-pap   Uterine leiomyoma    Allergies: No Known Allergies   Surgical History:  She  has a past surgical history that includes Abdominal hysterectomy; Appendectomy; abd infection; Bilateral oophorectomy; Breast reduction surgery; and Colonoscopy. Family History:  Her family history includes Breast cancer in her mother; Cancer in her father; Kidney disease in her mother; Liver cancer in her father; Prostate cancer in her father; Pulmonary embolism in her sister.  REVIEW OF SYSTEMS  : All other systems reviewed and negative except where noted in the History of Present Illness.  PHYSICAL EXAM: BP 112/80   Pulse 78   Ht 5' 3.5" (1.613 m)   Wt 252 lb (114.3 kg)   BMI 43.94 kg/m   General Appearance: Well nourished, in no apparent distress. Head:   Normocephalic and atraumatic. Eyes:  sclerae anicteric,conjunctive pink  Respiratory: Respiratory effort normal, BS equal bilaterally without rales, rhonchi, wheezing. Cardio: RRR with no MRGs. Peripheral pulses intact.  Abdomen: Soft,  Obese ,active bowel sounds. No tenderness . Without guarding and Without rebound. No masses. Rectal: Not evaluated Musculoskeletal: Full ROM, Normal gait. Without edema. Skin:  Dry and intact without significant lesions or rashes Neuro: Alert and  oriented x4;  No focal deficits. Psych:  Cooperative. Normal mood and affect.    Dyanne Carrel  Steffanie Dunn, PA-C 1:14 PM

## 2022-05-20 ENCOUNTER — Ambulatory Visit: Payer: Commercial Managed Care - HMO | Admitting: Physician Assistant

## 2022-05-20 ENCOUNTER — Encounter: Payer: Self-pay | Admitting: Physician Assistant

## 2022-05-20 ENCOUNTER — Other Ambulatory Visit: Payer: Self-pay | Admitting: Internal Medicine

## 2022-05-20 VITALS — BP 112/80 | HR 78 | Ht 63.5 in | Wt 252.0 lb

## 2022-05-20 DIAGNOSIS — K824 Cholesterolosis of gallbladder: Secondary | ICD-10-CM | POA: Diagnosis not present

## 2022-05-20 DIAGNOSIS — K760 Fatty (change of) liver, not elsewhere classified: Secondary | ICD-10-CM | POA: Diagnosis not present

## 2022-05-20 DIAGNOSIS — Z1211 Encounter for screening for malignant neoplasm of colon: Secondary | ICD-10-CM | POA: Diagnosis not present

## 2022-05-20 NOTE — Patient Instructions (Addendum)
Not due for colonoscopy until 03/2024 unless you have issues.   Toileting tips to help with your constipation - Drink at least 64-80 ounces of water/liquid per day. - Establish a time to try to move your bowels every day.  For many people, this is after a cup of coffee or after a meal such as breakfast. - Sit all of the way back on the toilet keeping your back fairly straight and while sitting up, try to rest the tops of your forearms on your upper thighs.   - Raising your feet with a step stool/squatty potty can be helpful to improve the angle that allows your stool to pass through the rectum. - Relax the rectum feeling it bulge toward the toilet water.  If you feel your rectum raising toward your body, you are contracting rather than relaxing. - Breathe in and slowly exhale. "Belly breath" by expanding your belly towards your belly button. Keep belly expanded as you gently direct pressure down and back to the anus.  A low pitched GRRR sound can assist with increasing intra-abdominal pressure.  - Repeat 3-4 times. If unsuccessful, contract the pelvic floor to restore normal tone and get off the toilet.  Avoid excessive straining. - To reduce excessive wiping by teaching your anus to normally contract, place hands on outer aspect of knees and resist knee movement outward.  Hold 5-10 second then place hands just inside of knees and resist inward movement of knees.  Hold 5 seconds.  Repeat a few times each way.   Metabolic dysfunction associated seatohepatitis  Now the leading cause of liver failure in the [place].  It is normally from such risk factors as obesity, diabetes, insulin resistance, high cholesterol, or metabolic syndrome.  The only definitive therapy is weight loss and exercise.   Suggest walking 20-30 mins daily.  Decreasing carbohydrates, increasing veggies.    Fatty Liver Fatty liver is the accumulation of fat in liver cells. It is also called hepatosteatosis or  steatohepatitis. It is normal for your liver to contain some fat. If fat is more than 5 to 10% of your liver's weight, you have fatty liver.  There are often no symptoms (problems) for years while damage is still occurring. People often learn about their fatty liver when they have medical tests for other reasons. Fat can damage your liver for years or even decades without causing problems. When it becomes severe, it can cause fatigue, weight loss, weakness, and confusion. This makes you more likely to develop more serious liver problems. The liver is the largest organ in the body. It does a lot of work and often gives no warning signs when it is sick until late in a disease. The liver has many important jobs including: Breaking down foods. Storing vitamins, iron, and other minerals. Making proteins. Making bile for food digestion. Breaking down many products including medications, alcohol and some poisons.  PROGNOSIS  Fatty liver may cause no damage or it can lead to an inflammation of the liver. This is, called steatohepatitis.  Over time the liver may become scarred and hardened. This condition is called cirrhosis. Cirrhosis is serious and may lead to liver failure or cancer. NASH is one of the leading causes of cirrhosis. About 10-20% of Americans have fatty liver and a smaller 2-5% has NASH.  TREATMENT  Weight loss, fat restriction, and exercise in overweight patients produces inconsistent results but is worth trying. Good control of diabetes may reduce fatty liver. Eat a balanced, healthy  diet. Increase your physical activity. There are no medical or surgical treatments for a fatty liver or NASH, but improving your diet and increasing your exercise may help prevent or reverse some of the damage.  _______________________________________________________  If your blood pressure at your visit was 140/90 or greater, please contact your primary care physician to follow up on  this.  _______________________________________________________  If you are age 18 or older, your body mass index should be between 23-30. Your Body mass index is 43.94 kg/m. If this is out of the aforementioned range listed, please consider follow up with your Primary Care Provider.  If you are age 24 or younger, your body mass index should be between 19-25. Your Body mass index is 43.94 kg/m. If this is out of the aformentioned range listed, please consider follow up with your Primary Care Provider.   ________________________________________________________  The [facility] providers would like to encourage you to use [facility] to communicate with providers for non-urgent requests or questions.  Due to long hold times on the telephone, sending your provider a message by [facility] may be a faster and more efficient way to get a response.  Please allow 48 business hours for a response.  Please remember that this is for non-urgent requests.  _______________________________________________________ It was a pleasure to see you today!  Thank you for trusting me with your gastrointestinal care!

## 2022-05-23 ENCOUNTER — Encounter: Payer: Self-pay | Admitting: Internal Medicine

## 2022-06-24 ENCOUNTER — Ambulatory Visit (HOSPITAL_COMMUNITY)
Admission: RE | Admit: 2022-06-24 | Discharge: 2022-06-24 | Disposition: A | Discharge status: Home / Self Care | Payer: Commercial Managed Care - HMO | Source: Ambulatory Visit | Attending: Physician Assistant | Admitting: Physician Assistant

## 2022-06-24 DIAGNOSIS — K824 Cholesterolosis of gallbladder: Secondary | ICD-10-CM

## 2022-06-25 ENCOUNTER — Other Ambulatory Visit: Payer: Self-pay

## 2022-06-25 DIAGNOSIS — K824 Cholesterolosis of gallbladder: Secondary | ICD-10-CM

## 2022-07-16 ENCOUNTER — Ambulatory Visit: Payer: Commercial Managed Care - HMO | Admitting: General Surgery

## 2022-07-28 ENCOUNTER — Ambulatory Visit: Payer: Commercial Managed Care - HMO | Admitting: Internal Medicine

## 2022-08-17 ENCOUNTER — Ambulatory Visit (INDEPENDENT_AMBULATORY_CARE_PROVIDER_SITE_OTHER): Payer: Commercial Managed Care - HMO | Admitting: Internal Medicine

## 2022-08-17 VITALS — BP 120/80 | HR 72 | Temp 98.3°F | Ht 63.5 in | Wt 246.0 lb

## 2022-08-17 DIAGNOSIS — Z7984 Long term (current) use of oral hypoglycemic drugs: Secondary | ICD-10-CM | POA: Diagnosis not present

## 2022-08-17 DIAGNOSIS — R944 Abnormal results of kidney function studies: Secondary | ICD-10-CM

## 2022-08-17 DIAGNOSIS — Z6841 Body Mass Index (BMI) 40.0 and over, adult: Secondary | ICD-10-CM

## 2022-08-17 DIAGNOSIS — E118 Type 2 diabetes mellitus with unspecified complications: Secondary | ICD-10-CM | POA: Diagnosis not present

## 2022-08-17 MED ORDER — TRULICITY 3 MG/0.5ML ~~LOC~~ SOAJ: 3.0000 mg | SUBCUTANEOUS | 5 refills | Status: DC

## 2022-08-17 NOTE — Assessment & Plan Note (Signed)
Metformin - d/c; intolerant Increase Trulicity dose

## 2022-08-17 NOTE — Progress Notes (Signed)
Subjective:  Patient ID: Carol Conner, female    DOB: February 14, 1960  Age: 62 y.o. MRN: [id number]  CC: Follow-up (3 MNTH F/U)   HPI Carol Conner presents for DM, HTN, fatty liver  Outpatient Medications Prior to Visit  Medication Sig Dispense Refill   buPROPion (WELLBUTRIN XL) 150 MG 24 hr tablet Take 150 mg by mouth every morning.     Cholecalciferol (VITAMIN D3) 50 MCG (2000 UT) capsule Take 1 capsule (2,000 Units total) by mouth daily. 100 capsule 3   Dulaglutide (TRULICITY) 3 MG/0.5ML SOPN Inject 3 mg as directed once a week. 6 mL 1   magnesium 30 MG tablet Take 500 mg by mouth at bedtime.      OIL OF OREGANO PO Take 2 each by mouth 3 times/day as needed-between meals & bedtime.     Omega-3 Fatty Acids (FISH OIL) 1000 MG CAPS Take by mouth daily.     Dulaglutide (TRULICITY) 1.5 MG/0.5ML SOPN INJECT 1.5 MG SUBCUTANEOUSLY ONCE A WEEK 4 mL 2   metFORMIN (GLUCOPHAGE) 500 MG tablet Take 1 tablet (500 mg total) by mouth daily with breakfast. 30 tablet 11   No facility-administered medications prior to visit.    ROS: Review of Systems  Constitutional:  Negative for activity change, appetite change, chills, fatigue and unexpected weight change.  HENT:  Negative for congestion, mouth sores and sinus pressure.   Eyes:  Negative for visual disturbance.  Respiratory:  Negative for cough and chest tightness.   Gastrointestinal:  Positive for constipation and nausea. Negative for abdominal pain.  Genitourinary:  Negative for difficulty urinating, frequency and vaginal pain.  Musculoskeletal:  Negative for back pain and gait problem.  Skin:  Negative for pallor and rash.  Neurological:  Negative for dizziness, tremors, weakness, numbness and headaches.  Psychiatric/Behavioral:  Negative for confusion and sleep disturbance.     Objective:  BP 120/80 (BP Location: Left Arm, Patient Position: Sitting, Cuff Size: Large)   Pulse 72   Temp 98.3 F (36.8 C) (Oral)   Ht 5' 3.5" (1.613 m)    Wt 246 lb (111.6 kg)   SpO2 98%   BMI 42.89 kg/m   BP Readings from Last 3 Encounters:  08/17/22 120/80  05/20/22 112/80  04/30/22 110/60    Wt Readings from Last 3 Encounters:  08/17/22 246 lb (111.6 kg)  05/20/22 252 lb (114.3 kg)  04/30/22 242 lb (109.8 kg)    Physical Exam Constitutional:      Appearance: She is obese. She is not ill-appearing.  Neurological:     Mental Status: She is oriented to person, place, and time.     Lab Results  Component Value Date   WBC 4.1 04/09/2022   HGB 13.8 04/09/2022   HCT 41.1 04/09/2022   PLT 218.0 04/09/2022   GLUCOSE 97 04/09/2022   CHOL 195 04/09/2022   TRIG 68.0 04/09/2022   HDL 68.70 04/09/2022   LDLCALC 112 (H) 04/09/2022   ALT 83 (H) 04/09/2022   AST 68 (H) 04/09/2022   NA 140 04/09/2022   K 4.3 04/09/2022   CL 104 04/09/2022   CREATININE 1.09 04/09/2022   BUN 15 04/09/2022   CO2 29 04/09/2022   TSH 1.54 04/18/2021   HGBA1C 6.2 04/09/2022   MICROALBUR <0.7 04/09/2022    US ABDOMEN LIMITED RUQ (LIVER/GB)  Result Date: 06/24/2022 CLINICAL DATA:  Elevated LFTs EXAM: ULTRASOUND ABDOMEN LIMITED RIGHT UPPER QUADRANT COMPARISON:  Ultrasound right upper quadrant 07/24/2021 FINDINGS: Gallbladder: There is a  similar-appearing 9-10 mm polyp in the gallbladder lumen. No gallbladder wall thickening or pericholecystic fluid. Negative sonographic Murphy's sign. Common bile duct: Diameter: 4.3 mm Liver: Increased echogenicity. No focal lesion. Portal vein is patent on color Doppler imaging with normal direction of blood flow towards the liver. Other: None. IMPRESSION: 1. Similar-appearing 9-10 mm polyp in the gallbladder lumen. Recommend follow-up right upper quadrant ultrasound in 6 months. 2. Increased hepatic parenchymal echogenicity suggestive of steatosis. Electronically Signed   By: Annia Belt M.D.   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    Dispense:  2 mL    Refill:  5      Follow-up: No follow-ups on file.  Sonda Primes, MD

## 2022-08-17 NOTE — Assessment & Plan Note (Signed)
Hydrate well ?Monitor GFR ?

## 2022-08-17 NOTE — Assessment & Plan Note (Signed)
Losing wt Wt Readings from Last 3 Encounters:  08/17/22 246 lb (111.6 kg)  05/20/22 252 lb (114.3 kg)  04/30/22 242 lb (109.8 kg)

## 2022-08-21 ENCOUNTER — Encounter: Payer: Self-pay | Admitting: Internal Medicine

## 2022-08-28 ENCOUNTER — Other Ambulatory Visit: Payer: Self-pay | Admitting: Internal Medicine

## 2022-08-28 ENCOUNTER — Telehealth: Payer: Self-pay

## 2022-08-28 ENCOUNTER — Other Ambulatory Visit (HOSPITAL_COMMUNITY): Payer: Self-pay

## 2022-08-28 MED ORDER — TRULICITY 1.5 MG/0.5ML ~~LOC~~ SOAJ: 1.5000 mg | SUBCUTANEOUS | 0 refills | Status: DC

## 2022-08-28 NOTE — Addendum Note (Signed)
Addended by: Deatra James on: 08/28/2022 11:10 AM   Modules accepted: Orders

## 2022-08-28 NOTE — Telephone Encounter (Signed)
Noted../lmb 

## 2022-08-28 NOTE — Telephone Encounter (Signed)
Patient states that a request for the 1.5 is being sent today.  Please approve  until the 3.0 approved

## 2022-08-28 NOTE — Telephone Encounter (Signed)
Pharmacy Patient Advocate Encounter   Received notification from Patient Advice Request messages that prior authorization for Trulicity 3MG /0.5ML pen-injectors is required/requested.   Insurance verification completed.   The patient is insured through [employer] .   Per test claim: PA required; PA submitted to [employer] via CoverMyMeds Key/confirmation #/EOC [id number] Status is pending

## 2022-08-28 NOTE — Telephone Encounter (Signed)
Pt need PA on Trulicity 3 mg/0.57ml

## 2022-08-31 ENCOUNTER — Other Ambulatory Visit (HOSPITAL_COMMUNITY): Payer: Self-pay

## 2022-08-31 NOTE — Telephone Encounter (Signed)
Pharmacy Patient Advocate Encounter  Received notification from [employer] that Prior Authorization for Trulicity 3MG /0.5ML pen-injectors has been APPROVED from 08/14/22 to 02/28/23   PA #/Case ID/Reference #: [id number]

## 2022-11-09 ENCOUNTER — Ambulatory Visit: Payer: Commercial Managed Care - HMO | Admitting: Internal Medicine

## 2022-11-27 ENCOUNTER — Encounter: Payer: Self-pay | Admitting: Physician Assistant

## 2023-02-15 ENCOUNTER — Telehealth: Payer: Self-pay | Admitting: Pharmacy Technician

## 2023-02-15 ENCOUNTER — Other Ambulatory Visit (HOSPITAL_COMMUNITY): Payer: Self-pay

## 2023-02-15 NOTE — Telephone Encounter (Signed)
Pharmacy Patient Advocate Encounter  Received notification from [employer] that Prior Authorization for Trulicity 3MG /0.5ML auto-injectors  has been APPROVED from 02/15/2023 to 02/15/2024. Unable to obtain price due to refill too soon rejection, last fill date 01/25/2023 next available fill date01/31/2025   PA #/Case ID/Reference #: [id number]

## 2023-02-15 NOTE — Telephone Encounter (Signed)
Pharmacy Patient Advocate Encounter   Received notification from CoverMyMeds that prior authorization for Trulicity 3MG /0.5ML auto-injectors is required/requested.   Insurance verification completed.   The patient is insured through [employer] .   Per test claim: PA required; PA submitted to above mentioned insurance via CoverMyMeds Key/confirmation #/EOC [id number] Status is pending

## 2023-02-23 ENCOUNTER — Ambulatory Visit: Payer: Commercial Managed Care - HMO | Admitting: Internal Medicine

## 2023-02-23 ENCOUNTER — Encounter: Payer: Self-pay | Admitting: Internal Medicine

## 2023-02-23 VITALS — BP 122/86 | HR 82 | Temp 98.1°F | Resp 16 | Ht 63.5 in | Wt 240.8 lb

## 2023-02-23 DIAGNOSIS — R339 Retention of urine, unspecified: Secondary | ICD-10-CM | POA: Diagnosis not present

## 2023-02-23 DIAGNOSIS — E785 Hyperlipidemia, unspecified: Secondary | ICD-10-CM

## 2023-02-23 DIAGNOSIS — E118 Type 2 diabetes mellitus with unspecified complications: Secondary | ICD-10-CM | POA: Diagnosis not present

## 2023-02-23 DIAGNOSIS — Z7984 Long term (current) use of oral hypoglycemic drugs: Secondary | ICD-10-CM

## 2023-02-23 DIAGNOSIS — N3000 Acute cystitis without hematuria: Secondary | ICD-10-CM

## 2023-02-23 DIAGNOSIS — K760 Fatty (change of) liver, not elsewhere classified: Secondary | ICD-10-CM | POA: Diagnosis not present

## 2023-02-23 DIAGNOSIS — N182 Chronic kidney disease, stage 2 (mild): Secondary | ICD-10-CM | POA: Insufficient documentation

## 2023-02-23 DIAGNOSIS — R8281 Pyuria: Secondary | ICD-10-CM

## 2023-02-23 LAB — MICROALBUMIN / CREATININE URINE RATIO
Creatinine,U: 267.2 mg/dL
Microalb Creat Ratio: 1.4 mg/g (ref 0.0–30.0)
Microalb, Ur: 3.7 mg/dL — ABNORMAL HIGH (ref 0.0–1.9)

## 2023-02-23 LAB — BASIC METABOLIC PANEL
BUN: 10 mg/dL (ref 6–23)
CO2: 27 meq/L (ref 19–32)
Calcium: 9.5 mg/dL (ref 8.4–10.5)
Chloride: 101 meq/L (ref 96–112)
Creatinine, Ser: 1.14 mg/dL (ref 0.40–1.20)
GFR: 52.36 mL/min — ABNORMAL LOW (ref >60.00)
Glucose, Bld: 79 mg/dL (ref 70–99)
Potassium: 4.2 meq/L (ref 3.5–5.1)
Sodium: 136 meq/L (ref 135–145)

## 2023-02-23 LAB — HEPATIC FUNCTION PANEL
ALT: 69 U/L — ABNORMAL HIGH (ref 0–35)
AST: 61 U/L — ABNORMAL HIGH (ref 0–37)
Albumin: 4.2 g/dL (ref 3.5–5.2)
Alkaline Phosphatase: 78 U/L (ref 39–117)
Bilirubin, Direct: 0.1 mg/dL (ref 0.0–0.3)
Total Bilirubin: 0.9 mg/dL (ref 0.2–1.2)
Total Protein: 7.9 g/dL (ref 6.0–8.3)

## 2023-02-23 LAB — POCT URINALYSIS DIPSTICK
Bilirubin, UA: POSITIVE
Blood, UA: NEGATIVE
Glucose, UA: POSITIVE — AB
Ketones, UA: NEGATIVE
Nitrite, UA: NEGATIVE
Protein, UA: POSITIVE — AB
Spec Grav, UA: 1.015 (ref 1.010–1.025)
Urobilinogen, UA: 0.2 U/dL
pH, UA: 8.5 — AB (ref 5.0–8.0)

## 2023-02-23 LAB — CBC WITH DIFFERENTIAL/PLATELET
Basophils Absolute: 0 10*3/uL (ref 0.0–0.1)
Basophils Relative: 0.6 % (ref 0.0–3.0)
Eosinophils Absolute: 0.1 10*3/uL (ref 0.0–0.7)
Eosinophils Relative: 2.1 % (ref 0.0–5.0)
HCT: 43.9 % (ref 36.0–46.0)
Hemoglobin: 14.5 g/dL (ref 12.0–15.0)
Lymphocytes Relative: 47.5 % — ABNORMAL HIGH (ref 12.0–46.0)
Lymphs Abs: 1.8 10*3/uL (ref 0.7–4.0)
MCHC: 33 g/dL (ref 30.0–36.0)
MCV: 93.1 fL (ref 78.0–100.0)
Monocytes Absolute: 0.3 10*3/uL (ref 0.1–1.0)
Monocytes Relative: 6.7 % (ref 3.0–12.0)
Neutro Abs: 1.6 10*3/uL (ref 1.4–7.7)
Neutrophils Relative %: 43.1 % (ref 43.0–77.0)
Platelets: 240 10*3/uL (ref 150.0–400.0)
RBC: 4.72 Mil/uL (ref 3.87–5.11)
RDW: 14.1 % (ref 11.5–15.5)
WBC: 3.8 10*3/uL — ABNORMAL LOW (ref 4.0–10.5)

## 2023-02-23 LAB — TSH: TSH: 1.41 u[IU]/mL (ref 0.35–5.50)

## 2023-02-23 LAB — PROTIME-INR
INR: 1.2 {ratio} — ABNORMAL HIGH (ref 0.8–1.0)
Prothrombin Time: 12.5 s (ref 9.6–13.1)

## 2023-02-23 LAB — HEMOGLOBIN A1C: Hgb A1c MFr Bld: 5.7 % (ref 4.6–6.5)

## 2023-02-23 MED ORDER — SULFAMETHOXAZOLE-TRIMETHOPRIM 800-160 MG PO TABS: 1.0000 | ORAL_TABLET | Freq: Two times a day (BID) | ORAL | 0 refills | Status: AC

## 2023-02-23 MED ORDER — EMPAGLIFLOZIN 10 MG PO TABS: 10.0000 mg | ORAL_TABLET | Freq: Every day | ORAL | 0 refills | Status: DC

## 2023-02-23 NOTE — Patient Instructions (Signed)

## 2023-02-23 NOTE — Progress Notes (Signed)
 Subjective:  Patient ID: Carol Conner, female    DOB: 1960/07/22  Age: 64 y.o. MRN: [id number]  CC: Diabetes and Urinary Tract Infection   HPI Carol Conner presents for f/up ----  Discussed the use of AI scribe software for clinical note transcription with the patient, who gave verbal consent to proceed.  History of Present Illness   Carol Conner is a 63 year old female who presents with symptoms suggestive of a urinary tract infection.  She experiences a sensation of tightness in the urinary tract, which she associates with the onset of a urinary tract infection. She has dysuria but denies hematuria, nausea, vomiting, fever, or chills. She plans to travel next week and wanted to address the issue before her trip.  She has a history of urinary tract infections but has never had kidney stones. She also mentions a gallbladder cyst that was recommended for removal but has not caused significant issues.  She is currently taking Trulicity  for diabetes without experiencing any side effects such as abdominal pain, nausea, or vomiting. She does not routinely receive flu or pneumonia vaccines but has had the shingles vaccine.  During the review of symptoms, she denies side pain, kidney pain, chest pain, shortness of breath, dizziness, or lightheadedness. She reports being active, engaging in activities such as walking and climbing stairs. She is not aware of any antibiotic allergies.       Outpatient Medications Prior to Visit  Medication Sig Dispense Refill   buPROPion (WELLBUTRIN XL) 150 MG 24 hr tablet Take 150 mg by mouth every morning.     Cholecalciferol (VITAMIN D3) 50 MCG (2000 UT) capsule Take 1 capsule (2,000 Units total) by mouth daily. 100 capsule 3   Dulaglutide  (TRULICITY ) 1.5 MG/0.5ML SOPN Inject 1.5 mg into the skin once a week. Need approval for 3 mg/0.5 ml 2 mL 0   Dulaglutide  (TRULICITY ) 3 MG/0.5ML SOPN Inject 3 mg as directed once a week. 6 mL 1   Dulaglutide   (TRULICITY ) 3 MG/0.5ML SOPN Inject 3 mg as directed once a week. 2 mL 5   magnesium 30 MG tablet Take 500 mg by mouth at bedtime.      OIL OF OREGANO PO Take 2 each by mouth 3 times/day as needed-between meals & bedtime.     Omega-3 Fatty Acids (FISH OIL) 1000 MG CAPS Take by mouth daily.     OVER THE COUNTER MEDICATION Meno Vaginal Support Capsules     No facility-administered medications prior to visit.    ROS Review of Systems  Constitutional:  Negative for appetite change, diaphoresis, fatigue and fever.  HENT: Negative.    Eyes: Negative.   Respiratory: Negative.  Negative for cough, chest tightness, shortness of breath and wheezing.   Cardiovascular:  Negative for chest pain, palpitations and leg swelling.  Gastrointestinal: Negative.  Negative for abdominal pain, constipation, diarrhea, nausea and vomiting.  Genitourinary:  Positive for dysuria. Negative for decreased urine volume, difficulty urinating, flank pain, hematuria and urgency.  Musculoskeletal: Negative.  Negative for arthralgias.  Skin: Negative.   Neurological:  Negative for dizziness and weakness.  Hematological:  Negative for adenopathy. Does not bruise/bleed easily.  Psychiatric/Behavioral: Negative.      Objective:  BP 122/86 (BP Location: Left Arm, Patient Position: Sitting, Cuff Size: Normal)   Pulse 82   Temp 98.1 F (36.7 C) (Oral)   Resp 16   Ht 5' 3.5 (1.613 m)   Wt 240 lb 12.8 oz (109.2 kg)  SpO2 98%   BMI 41.99 kg/m   BP Readings from Last 3 Encounters:  02/23/23 122/86  08/17/22 120/80  05/20/22 112/80    Wt Readings from Last 3 Encounters:  02/23/23 240 lb 12.8 oz (109.2 kg)  08/17/22 246 lb (111.6 kg)  05/20/22 252 lb (114.3 kg)    Physical Exam Vitals reviewed.  Constitutional:      Appearance: Normal appearance. She is obese.  HENT:     Mouth/Throat:     Mouth: Mucous membranes are moist.  Eyes:     General: No scleral icterus.    Conjunctiva/sclera: Conjunctivae normal.   Cardiovascular:     Rate and Rhythm: Normal rate and regular rhythm.     Heart sounds: No murmur heard.    No gallop.  Pulmonary:     Effort: Pulmonary effort is normal.     Breath sounds: No stridor. No wheezing, rhonchi or rales.  Abdominal:     General: Abdomen is flat.     Palpations: There is no mass.     Tenderness: There is no abdominal tenderness. There is no guarding.     Hernia: No hernia is present.  Musculoskeletal:        General: Normal range of motion.     Cervical back: Neck supple.     Right lower leg: No edema.     Left lower leg: No edema.  Lymphadenopathy:     Cervical: No cervical adenopathy.  Skin:    General: Skin is warm and dry.  Neurological:     General: No focal deficit present.     Mental Status: She is alert.  Psychiatric:        Mood and Affect: Mood normal.        Behavior: Behavior normal.     Lab Results  Component Value Date   WBC 3.8 (L) 02/23/2023   HGB 14.5 02/23/2023   HCT 43.9 02/23/2023   PLT 240.0 02/23/2023   GLUCOSE 79 02/23/2023   CHOL 195 04/09/2022   TRIG 68.0 04/09/2022   HDL 68.70 04/09/2022   LDLCALC 112 (H) 04/09/2022   ALT 69 (H) 02/23/2023   AST 61 (H) 02/23/2023   NA 136 02/23/2023   K 4.2 02/23/2023   CL 101 02/23/2023   CREATININE 1.14 02/23/2023   BUN 10 02/23/2023   CO2 27 02/23/2023   TSH 1.41 02/23/2023   INR 1.2 (H) 02/23/2023   HGBA1C 5.7 02/23/2023   MICROALBUR 3.7 (H) 02/23/2023    US  ABDOMEN LIMITED RUQ (LIVER/GB) Result Date: 06/24/2022 CLINICAL DATA:  Elevated LFTs EXAM: ULTRASOUND ABDOMEN LIMITED RIGHT UPPER QUADRANT COMPARISON:  Ultrasound right upper quadrant 07/24/2021 FINDINGS: Gallbladder: There is a similar-appearing 9-10 mm polyp in the gallbladder lumen. No gallbladder wall thickening or pericholecystic fluid. Negative sonographic Murphy's sign. Common bile duct: Diameter: 4.3 mm Liver: Increased echogenicity. No focal lesion. Portal vein is patent on color Doppler imaging with normal  direction of blood flow towards the liver. Other: None. IMPRESSION: 1. Similar-appearing 9-10 mm polyp in the gallbladder lumen. Recommend follow-up right upper quadrant ultrasound in 6 months. 2. Increased hepatic parenchymal echogenicity suggestive of steatosis. Electronically Signed   By: Bard Moats M.D.   On: 06/24/2022 08:58    Assessment & Plan:  Incomplete emptying of bladder -     POCT urinalysis dipstick  Diabetes mellitus type 2 with complications (HCC) -     Basic metabolic panel; Future -     CBC with Differential/Platelet; Future -  Hemoglobin A1c; Future -     Microalbumin / creatinine urine ratio; Future -     Empagliflozin ; Take 1 tablet (10 mg total) by mouth daily before breakfast.  Dispense: 90 tablet; Refill: 0  Fatty liver disease, nonalcoholic -     Hepatic function panel; Future -     Protime-INR; Future  Dyslipidemia, goal LDL below 100 -     Hepatic function panel; Future -     TSH; Future  Pyuria -     CULTURE, URINE COMPREHENSIVE; Future  Acute cystitis without hematuria -     Sulfamethoxazole -Trimethoprim ; Take 1 tablet by mouth 2 (two) times daily for 5 days.  Dispense: 10 tablet; Refill: 0  Stage 2 chronic kidney disease- Will start an SGLT2 inh.     Follow-up: Return in about 3 months (around 05/23/2023).  Debby Molt, MD

## 2023-02-25 LAB — CULTURE, URINE COMPREHENSIVE

## 2023-03-11 ENCOUNTER — Other Ambulatory Visit: Payer: Self-pay | Admitting: Internal Medicine

## 2023-03-13 ENCOUNTER — Encounter: Payer: Self-pay | Admitting: Internal Medicine

## 2023-03-15 ENCOUNTER — Other Ambulatory Visit: Payer: Self-pay

## 2023-03-15 MED ORDER — TRULICITY 3 MG/0.5ML ~~LOC~~ SOAJ: 3.0000 mg | SUBCUTANEOUS | 5 refills | Status: DC

## 2023-03-25 ENCOUNTER — Encounter: Payer: Self-pay | Admitting: Internal Medicine

## 2023-03-25 ENCOUNTER — Ambulatory Visit: Payer: Commercial Managed Care - HMO | Admitting: Internal Medicine

## 2023-03-25 VITALS — BP 110/70 | HR 77 | Temp 98.3°F | Ht 63.5 in | Wt 242.0 lb

## 2023-03-25 DIAGNOSIS — F411 Generalized anxiety disorder: Secondary | ICD-10-CM | POA: Diagnosis not present

## 2023-03-25 DIAGNOSIS — N182 Chronic kidney disease, stage 2 (mild): Secondary | ICD-10-CM | POA: Diagnosis not present

## 2023-03-25 DIAGNOSIS — K760 Fatty (change of) liver, not elsewhere classified: Secondary | ICD-10-CM

## 2023-03-25 DIAGNOSIS — E118 Type 2 diabetes mellitus with unspecified complications: Secondary | ICD-10-CM | POA: Diagnosis not present

## 2023-03-25 DIAGNOSIS — Z7984 Long term (current) use of oral hypoglycemic drugs: Secondary | ICD-10-CM

## 2023-03-25 DIAGNOSIS — Z7985 Long-term (current) use of injectable non-insulin antidiabetic drugs: Secondary | ICD-10-CM

## 2023-03-25 MED ORDER — TRULICITY 3 MG/0.5ML ~~LOC~~ SOAJ: 3.0000 mg | SUBCUTANEOUS | 5 refills | Status: AC

## 2023-03-25 MED ORDER — EMPAGLIFLOZIN 10 MG PO TABS: 10.0000 mg | ORAL_TABLET | Freq: Every day | ORAL | 3 refills | Status: AC

## 2023-03-25 NOTE — Assessment & Plan Note (Signed)
On Jardiance  

## 2023-03-25 NOTE — Assessment & Plan Note (Signed)
 Increase Trulicity dose Continue Jardiance

## 2023-03-25 NOTE — Assessment & Plan Note (Signed)
 Stable LFTs Cont w/Trulicity

## 2023-03-25 NOTE — Assessment & Plan Note (Signed)
 We increased Trulicity

## 2023-03-25 NOTE — Progress Notes (Signed)
 Subjective:  Patient ID: Carol Conner, female    DOB: 1960-02-11  Age: 63 y.o. MRN: [id number]  CC: Annual Exam   HPI Carol Conner presents for DM, depression, elevated LFTs  Outpatient Medications Prior to Visit  Medication Sig Dispense Refill   buPROPion (WELLBUTRIN XL) 150 MG 24 hr tablet Take 150 mg by mouth every morning.     Cholecalciferol (VITAMIN D3) 50 MCG (2000 UT) capsule Take 1 capsule (2,000 Units total) by mouth daily. 100 capsule 3   magnesium 30 MG tablet Take 500 mg by mouth at bedtime.      OIL OF OREGANO PO Take 2 each by mouth 3 times/day as needed-between meals & bedtime.     Omega-3 Fatty Acids (FISH OIL) 1000 MG CAPS Take by mouth daily.     OVER THE COUNTER MEDICATION Meno Vaginal Support Capsules     Dulaglutide (TRULICITY) 1.5 MG/0.5ML SOPN Inject 1.5 mg into the skin once a week. Need approval for 3 mg/0.5 ml 2 mL 0   Dulaglutide (TRULICITY) 3 MG/0.5ML SOAJ Inject 3 mg as directed once a week. 2 mL 5   Dulaglutide (TRULICITY) 3 MG/0.5ML SOPN Inject 3 mg as directed once a week. 6 mL 1   empagliflozin (JARDIANCE) 10 MG TABS tablet Take 1 tablet (10 mg total) by mouth daily before breakfast. 90 tablet 0   No facility-administered medications prior to visit.    ROS: Review of Systems  Constitutional:  Negative for activity change, appetite change, chills, fatigue and unexpected weight change.  HENT:  Negative for congestion, mouth sores and sinus pressure.   Eyes:  Negative for visual disturbance.  Respiratory:  Negative for cough and chest tightness.   Gastrointestinal:  Negative for abdominal pain and nausea.  Genitourinary:  Negative for difficulty urinating, frequency and vaginal pain.  Musculoskeletal:  Negative for back pain and gait problem.  Skin:  Negative for pallor and rash.  Neurological:  Negative for dizziness, tremors, weakness, numbness and headaches.  Psychiatric/Behavioral:  Negative for confusion and sleep disturbance.      Objective:  BP 110/70   Pulse 77   Temp 98.3 F (36.8 C) (Oral)   Ht 5' 3.5" (1.613 m)   Wt 242 lb (109.8 kg)   SpO2 96%   BMI 42.20 kg/m   BP Readings from Last 3 Encounters:  03/25/23 110/70  02/23/23 122/86  08/17/22 120/80    Wt Readings from Last 3 Encounters:  03/25/23 242 lb (109.8 kg)  02/23/23 240 lb 12.8 oz (109.2 kg)  08/17/22 246 lb (111.6 kg)    Physical Exam Constitutional:      General: She is not in acute distress.    Appearance: She is well-developed. She is obese.  HENT:     Head: Normocephalic.     Right Ear: External ear normal.     Left Ear: External ear normal.     Nose: Nose normal.  Eyes:     General:        Right eye: No discharge.        Left eye: No discharge.     Conjunctiva/sclera: Conjunctivae normal.     Pupils: Pupils are equal, round, and reactive to light.  Neck:     Thyroid: No thyromegaly.     Vascular: No JVD.     Trachea: No tracheal deviation.  Cardiovascular:     Rate and Rhythm: Normal rate and regular rhythm.     Heart sounds: Normal heart sounds.  Pulmonary:     Effort: No respiratory distress.     Breath sounds: No stridor. No wheezing.  Abdominal:     General: Bowel sounds are normal. There is no distension.     Palpations: Abdomen is soft. There is no mass.     Tenderness: There is no abdominal tenderness. There is no guarding or rebound.  Musculoskeletal:        General: No tenderness.     Cervical back: Normal range of motion and neck supple. No rigidity.  Lymphadenopathy:     Cervical: No cervical adenopathy.  Skin:    Findings: No erythema or rash.  Neurological:     Cranial Nerves: No cranial nerve deficit.     Motor: No abnormal muscle tone.     Coordination: Coordination normal.     Deep Tendon Reflexes: Reflexes normal.  Psychiatric:        Behavior: Behavior normal.        Thought Content: Thought content normal.        Judgment: Judgment normal.     Lab Results  Component Value Date    WBC 3.8 (L) 02/23/2023   HGB 14.5 02/23/2023   HCT 43.9 02/23/2023   PLT 240.0 02/23/2023   GLUCOSE 79 02/23/2023   CHOL 195 04/09/2022   TRIG 68.0 04/09/2022   HDL 68.70 04/09/2022   LDLCALC 112 (H) 04/09/2022   ALT 69 (H) 02/23/2023   AST 61 (H) 02/23/2023   NA 136 02/23/2023   K 4.2 02/23/2023   CL 101 02/23/2023   CREATININE 1.14 02/23/2023   BUN 10 02/23/2023   CO2 27 02/23/2023   TSH 1.41 02/23/2023   INR 1.2 (H) 02/23/2023   HGBA1C 5.7 02/23/2023   MICROALBUR 3.7 (H) 02/23/2023    US ABDOMEN LIMITED RUQ (LIVER/GB) Result Date: 06/24/2022 CLINICAL DATA:  Elevated LFTs EXAM: ULTRASOUND ABDOMEN LIMITED RIGHT UPPER QUADRANT COMPARISON:  Ultrasound right upper quadrant 07/24/2021 FINDINGS: Gallbladder: There is a similar-appearing 9-10 mm polyp in the gallbladder lumen. No gallbladder wall thickening or pericholecystic fluid. Negative sonographic Murphy's sign. Common bile duct: Diameter: 4.3 mm Liver: Increased echogenicity. No focal lesion. Portal vein is patent on color Doppler imaging with normal direction of blood flow towards the liver. Other: None. IMPRESSION: 1. Similar-appearing 9-10 mm polyp in the gallbladder lumen. Recommend follow-up right upper quadrant ultrasound in 6 months. 2. Increased hepatic parenchymal echogenicity suggestive of steatosis. Electronically Signed   By: Annia Belt M.D.   On: 06/24/2022 08:58    Assessment & Plan:   Problem List Items Addressed This Visit     Anxiety state - Primary   On Wellbutrin      Fatty liver disease, nonalcoholic   Stable LFTs Cont w/Trulicity      Diabetes mellitus type 2 with complications (HCC)   Increase Trulicity dose Continue Jardiance      Relevant Medications   Dulaglutide (TRULICITY) 3 MG/0.5ML SOAJ   empagliflozin (JARDIANCE) 10 MG TABS tablet   Morbid obesity (HCC)   We increased Trulicity      Relevant Medications   Dulaglutide (TRULICITY) 3 MG/0.5ML SOAJ   empagliflozin (JARDIANCE) 10 MG  TABS tablet   Stage 2 chronic kidney disease   On Jardiance         Meds ordered this encounter  Medications   Dulaglutide (TRULICITY) 3 MG/0.5ML SOAJ    Sig: Inject 3 mg as directed once a week.    Dispense:  2 mL    Refill:  5  empagliflozin (JARDIANCE) 10 MG TABS tablet    Sig: Take 1 tablet (10 mg total) by mouth daily before breakfast.    Dispense:  90 tablet    Refill:  3      Follow-up: No follow-ups on file.  Sonda Primes, MD

## 2023-03-25 NOTE — Assessment & Plan Note (Signed)
 On Wellbutrin

## 2023-04-05 ENCOUNTER — Ambulatory Visit: Payer: Commercial Managed Care - HMO | Admitting: Primary Care

## 2023-05-19 ENCOUNTER — Ambulatory Visit: Admitting: Primary Care

## 2023-05-19 ENCOUNTER — Encounter: Payer: Self-pay | Admitting: Primary Care

## 2023-05-19 VITALS — BP 109/75 | HR 84 | Temp 97.3°F | Ht 63.5 in | Wt 238.4 lb

## 2023-05-19 DIAGNOSIS — G4733 Obstructive sleep apnea (adult) (pediatric): Secondary | ICD-10-CM | POA: Diagnosis not present

## 2023-05-19 NOTE — Patient Instructions (Signed)
 -  OBSTRUCTIVE SLEEP APNEA (OSA): Obstructive Sleep Apnea is a condition where your airway becomes blocked during sleep, causing breathing pauses. Your OSA is well-controlled with your CPAP therapy, which you have been using consistently. Your CPAP settings are effective, and you have no issues with the mask or pressure settings. We will renew your CPAP supplies with [facility].  -POSTNASAL DRIP AND UPPER AIRWAY COUGH: Postnasal drip and upper airway cough are likely due to pollen exposure from your recent trip. Your sputum is clear. Continue to monitor your symptoms and consider using over-the-counter medications if needed.  INSTRUCTIONS:  We will renew your CPAP supplies with [facility]. Continue using your CPAP machine as directed. If your postnasal drip and cough persist or worsen, consider using over-the-counter medications and follow up with us  if needed.  Follow-up 1 year with [facility] NP or sooner if needed

## 2023-05-19 NOTE — Progress Notes (Signed)
 @Patient  ID: Carol Conner, female    DOB: 05/03/1960, 63 y.o.   MRN: [id number]  No chief complaint on file.   Referring provider: Plotnikov, Oakley Bellman, MD  HPI: 63 year old female, never smoked.  Past medical history significant for obstructive sleep apnea, dyslipidemia, fatty liver, type 2 diabetes, ovarian cyst, obesity.   Previous [facility] pulmonary encounter: 10/23/2021 Patient presents today for new sleep consult.  She was a former patient of Dr. Matilde Son, last seen for original consult on 09/29/2012.  She had a home sleep study on 10/03/2012 that showed mild obstructive sleep apnea, AHI 13.7 an hour with SPO2 low 85%. She was started on auto CPAP 5 to 20 cm H2O.  Doing well on CPAP. She has been on CPAP for 15 years. She reports benefit from use She is 100% compliant with use. No issues. She uses nasal pillow mask. No daytime sleepiness.  She is not currently establish with any DME company, she is getting her supplies online.   Dx with presumed pneumonia in [place]  on Sunday 10/19/21. At that time she was having symptoms of fever, chest heaviness and cough. Tx with Augmentin and azithromycin. She is on last day of antibiotics. She is feeling better.   Airview download 07/25/21-10/22/21 86/90 days used; 93% > 4 hours  Averaghe usage 6 hours 14 mins Pressure 5-20cm h20 (12.6cm h20- 95%) Airleaks 0.1L/min (95%) AHI 0.3   05/19/2023 Patient presents today for follow-up OSA on CPAP. Long standing hx sleep apnea, patient has been on CPAP for >15 years. Most recent sleep study was done in 2014 revealing mild OSA, AHI 13.7. Needs CPAP supplies renewed.  She has been using her CPAP machine consistently over the last thirty days with a compliance rate of ninety percent. Her average usage is six hours and twenty-one minutes per night. The CPAP settings are set to a pressure range of five to twenty centimeters H2O, and her residual apnea-hypopnea index (AHI) is 1.4/hour. She has no issues with the  mask at her current pressure settings and requires a renewal of her CPAP supplies.  She is experiencing postnasal drip and an upper airway cough, which she attributes to pollen exposure from a recent trip to [place] . The sputum is clear, and she is not currently taking any over-the-counter medications for these symptoms.  She is actively working on weight loss and has lost ten pounds since her last sleep study ten years ago. She is exercising at the gym, which she feels has improved her breathing. She has never smoked.  No acute respiratory complaints or issues today.     Allergies  Allergen Reactions   Metformin  And Related     Nausea, constipation    Immunization History  Administered Date(s) Administered   Hepatitis B, ADULT 04/18/2021   PFIZER(Purple Top)SARS-COV-2 Vaccination 03/31/2019, 04/21/2019, 12/13/2019   Zoster Recombinant(Shingrix ) 06/26/2021   Zoster, Live 04/18/2021    Past Medical History:  Diagnosis Date   Anxiety    Genital herpes    Heme positive stool    Sleep apnea    uses c-pap   Uterine leiomyoma     Tobacco History: Social History   Tobacco Use  Smoking Status Never   Passive exposure: Past  Smokeless Tobacco Never   Counseling given: Not Answered   Outpatient Medications Prior to Visit  Medication Sig Dispense Refill   buPROPion (WELLBUTRIN XL) 150 MG 24 hr tablet Take 150 mg by mouth every morning.     Cholecalciferol (VITAMIN D3) 50  MCG (2000 UT) capsule Take 1 capsule (2,000 Units total) by mouth daily. 100 capsule 3   Dulaglutide  (TRULICITY ) 3 MG/0.5ML SOAJ Inject 3 mg as directed once a week. 2 mL 5   empagliflozin  (JARDIANCE ) 10 MG TABS tablet Take 1 tablet (10 mg total) by mouth daily before breakfast. 90 tablet 3   magnesium 30 MG tablet Take 500 mg by mouth at bedtime.      OIL OF OREGANO PO Take 2 each by mouth 3 times/day as needed-between meals & bedtime.     Omega-3 Fatty Acids (FISH OIL) 1000 MG CAPS Take by mouth daily.      OVER THE COUNTER MEDICATION Meno Vaginal Support Capsules     No facility-administered medications prior to visit.   Review of Systems  Review of Systems  Constitutional: Negative.   HENT:  Positive for postnasal drip.   Respiratory:  Positive for cough. Negative for chest tightness, shortness of breath and wheezing.   Cardiovascular: Negative.      Physical Exam  There were no vitals taken for this visit. Physical Exam Constitutional:      Appearance: Normal appearance.  HENT:     Head: Normocephalic and atraumatic.  Cardiovascular:     Rate and Rhythm: Normal rate and regular rhythm.  Pulmonary:     Effort: Pulmonary effort is normal.     Breath sounds: Normal breath sounds.  Musculoskeletal:        General: Normal range of motion.  Skin:    General: Skin is warm and dry.  Neurological:     General: No focal deficit present.     Mental Status: She is alert and oriented to person, place, and time. Mental status is at baseline.  Psychiatric:        Mood and Affect: Mood normal.        Behavior: Behavior normal.        Thought Content: Thought content normal.        Judgment: Judgment normal.      Lab Results:  CBC    Component Value Date/Time   WBC 3.8 (L) 02/23/2023 1134   RBC 4.72 02/23/2023 1134   HGB 14.5 02/23/2023 1134   HCT 43.9 02/23/2023 1134   PLT 240.0 02/23/2023 1134   MCV 93.1 02/23/2023 1134   MCH 29.8 07/24/2019 1320   MCHC 33.0 02/23/2023 1134   RDW 14.1 02/23/2023 1134   LYMPHSABS 1.8 02/23/2023 1134   MONOABS 0.3 02/23/2023 1134   EOSABS 0.1 02/23/2023 1134   BASOSABS 0.0 02/23/2023 1134    BMET    Component Value Date/Time   NA 136 02/23/2023 1134   K 4.2 02/23/2023 1134   CL 101 02/23/2023 1134   CO2 27 02/23/2023 1134   GLUCOSE 79 02/23/2023 1134   BUN 10 02/23/2023 1134   CREATININE 1.14 02/23/2023 1134   CALCIUM 9.5 02/23/2023 1134   GFRNONAA >60 07/24/2019 1320   GFRAA >60 07/24/2019 1320    BNP No results found  for: "BNP"  ProBNP No results found for: "PROBNP"  Imaging: No results found.   Assessment & Plan:   1. OBSTRUCTIVE SLEEP APNEA (Primary) - Ambulatory Referral for DME  Assessment and Plan    Obstructive Sleep Apnea (OSA) OSA is well-controlled with CPAP therapy. Consistent CPAP usage at 90% over the last 30 days with an average usage of 6 hours and 21 minutes per night. CPAP pressure settings are 5 to 20 cm H2O with a residual AHI of 1.4/hour,  indicating good control. No issues with mask or pressure settings reported. - Renew CPAP supplies with [facility].  Postnasal drip and upper airway cough -Attributed to pollen exposure from a recent trip to [place] . Sputum is clear. No over-the-counter medications currently being taken for this issue. Continue to monitor symptoms and notify office if they worsen.        Antonio Baumgarten, NP 05/19/2023

## 2023-05-24 ENCOUNTER — Telehealth: Admitting: Physician Assistant

## 2023-05-24 DIAGNOSIS — J019 Acute sinusitis, unspecified: Secondary | ICD-10-CM

## 2023-05-24 DIAGNOSIS — B9689 Other specified bacterial agents as the cause of diseases classified elsewhere: Secondary | ICD-10-CM | POA: Diagnosis not present

## 2023-05-24 MED ORDER — PREDNISONE 20 MG PO TABS: 20.0000 mg | ORAL_TABLET | Freq: Every day | ORAL | 0 refills | Status: DC

## 2023-05-24 MED ORDER — AMOXICILLIN-POT CLAVULANATE 875-125 MG PO TABS
1.0000 | ORAL_TABLET | Freq: Two times a day (BID) | ORAL | 0 refills | Status: DC
Start: 2023-05-24 — End: 2023-10-05

## 2023-05-24 MED ORDER — BENZONATATE 100 MG PO CAPS: 100.0000 mg | ORAL_CAPSULE | Freq: Three times a day (TID) | ORAL | 0 refills | Status: AC | PRN

## 2023-05-24 NOTE — Patient Instructions (Signed)
 Alicyn B Eugene, thank you for joining Angelia Kelp, PA-C for today's virtual visit.  While this provider is not your primary care provider (PCP), if your PCP is located in our provider database this encounter information will be shared with them immediately following your visit.   A [facility] MyChart account gives you access to today's visit and all your visits, tests, and labs performed at [facility] " click here if you don't have a [facility] MyChart account or go to [email or web address]  Consent: (Patient) Reia B Cameron provided verbal consent for this virtual visit at the beginning of the encounter.  Current Medications:  Current Outpatient Medications:    amoxicillin-clavulanate (AUGMENTIN) 875-125 MG tablet, Take 1 tablet by mouth 2 (two) times daily., Disp: 20 tablet, Rfl: 0   benzonatate  (TESSALON ) 100 MG capsule, Take 1-2 capsules (100-200 mg total) by mouth 3 (three) times daily as needed., Disp: 30 capsule, Rfl: 0   buPROPion (WELLBUTRIN XL) 150 MG 24 hr tablet, Take 150 mg by mouth every morning., Disp: , Rfl:    Cholecalciferol (VITAMIN D3) 50 MCG (2000 UT) capsule, Take 1 capsule (2,000 Units total) by mouth daily., Disp: 100 capsule, Rfl: 3   Dulaglutide  (TRULICITY ) 3 MG/0.5ML SOAJ, Inject 3 mg as directed once a week., Disp: 2 mL, Rfl: 5   empagliflozin  (JARDIANCE ) 10 MG TABS tablet, Take 1 tablet (10 mg total) by mouth daily before breakfast., Disp: 90 tablet, Rfl: 3   magnesium 30 MG tablet, Take 500 mg by mouth at bedtime. , Disp: , Rfl:    OIL OF OREGANO PO, Take 2 each by mouth 3 times/day as needed-between meals & bedtime., Disp: , Rfl:    Omega-3 Fatty Acids (FISH OIL) 1000 MG CAPS, Take by mouth daily., Disp: , Rfl:    OVER THE COUNTER MEDICATION, Meno Vaginal Support Capsules, Disp: , Rfl:    predniSONE (DELTASONE) 20 MG tablet, Take 1 tablet (20 mg total) by mouth daily with breakfast., Disp: 5 tablet, Rfl: 0   Medications ordered in  this encounter:  Meds ordered this encounter  Medications   amoxicillin-clavulanate (AUGMENTIN) 875-125 MG tablet    Sig: Take 1 tablet by mouth 2 (two) times daily.    Dispense:  20 tablet    Refill:  0    Supervising Provider:   LAMPTEY, PHILIP O [[id number]]   predniSONE (DELTASONE) 20 MG tablet    Sig: Take 1 tablet (20 mg total) by mouth daily with breakfast.    Dispense:  5 tablet    Refill:  0    Supervising Provider:   LAMPTEY, PHILIP O [[id number]]   benzonatate  (TESSALON ) 100 MG capsule    Sig: Take 1-2 capsules (100-200 mg total) by mouth 3 (three) times daily as needed.    Dispense:  30 capsule    Refill:  0    Supervising Provider:   Corine Dice [[id number]]     *If you need refills on other medications prior to your next appointment, please contact your pharmacy*  Follow-Up: Call back or seek an in-person evaluation if the symptoms worsen or if the condition fails to improve as anticipated.  [facility] [phone]  Other Instructions Sinus Infection, Adult A sinus infection, also called sinusitis, is inflammation of your sinuses. Sinuses are hollow spaces in the bones around your face. Your sinuses are located: Around your eyes. In the middle of your forehead. Behind your nose. In your cheekbones. Mucus normally drains out of your  sinuses. When your nasal tissues become inflamed or swollen, mucus can become trapped or blocked. This allows bacteria, viruses, and fungi to grow, which leads to infection. Most infections of the sinuses are caused by a virus. A sinus infection can develop quickly. It can last for up to 4 weeks (acute) or for more than 12 weeks (chronic). A sinus infection often develops after a cold. What are the causes? This condition is caused by anything that creates swelling in the sinuses or stops mucus from draining. This includes: Allergies. Asthma. Infection from bacteria or viruses. Deformities or blockages in your nose or  sinuses. Abnormal growths in the nose (nasal polyps). Pollutants, such as chemicals or irritants in the air. Infection from fungi. This is rare. What increases the risk? You are more likely to develop this condition if you: Have a weak body defense system (immune system). Do a lot of swimming or diving. Overuse nasal sprays. Smoke. What are the signs or symptoms? The main symptoms of this condition are pain and a feeling of pressure around the affected sinuses. Other symptoms include: Stuffy nose or congestion that makes it difficult to breathe through your nose. Thick yellow or greenish drainage from your nose. Tenderness, swelling, and warmth over the affected sinuses. A cough that may get worse at night. Decreased sense of smell and taste. Extra mucus that collects in the throat or the back of the nose (postnasal drip) causing a sore throat or bad breath. Tiredness (fatigue). Fever. How is this diagnosed? This condition is diagnosed based on: Your symptoms. Your medical history. A physical exam. Tests to find out if your condition is acute or chronic. This may include: Checking your nose for nasal polyps. Viewing your sinuses using a device that has a light (endoscope). Testing for allergies or bacteria. Imaging tests, such as an MRI or CT scan. In rare cases, a bone biopsy may be done to rule out more serious types of fungal sinus disease. How is this treated? Treatment for a sinus infection depends on the cause and whether your condition is chronic or acute. If caused by a virus, your symptoms should go away on their own within 10 days. You may be given medicines to relieve symptoms. They include: Medicines that shrink swollen nasal passages (decongestants). A spray that eases inflammation of the nostrils (topical intranasal corticosteroids). Rinses that help get rid of thick mucus in your nose (nasal saline washes). Medicines that treat allergies  (antihistamines). Over-the-counter pain relievers. If caused by bacteria, your health care provider may recommend waiting to see if your symptoms improve. Most bacterial infections will get better without antibiotic medicine. You may be given antibiotics if you have: A severe infection. A weak immune system. If caused by narrow nasal passages or nasal polyps, surgery may be needed. Follow these instructions at home: Medicines Take, use, or apply over-the-counter and prescription medicines only as told by your health care provider. These may include nasal sprays. If you were prescribed an antibiotic medicine, take it as told by your health care provider. Do not stop taking the antibiotic even if you start to feel better. Hydrate and humidify  Drink enough fluid to keep your urine pale yellow. Staying hydrated will help to thin your mucus. Use a cool mist humidifier to keep the humidity level in your home above 50%. Inhale steam for 10-15 minutes, 3-4 times a day, or as told by your health care provider. You can do this in the bathroom while a hot shower is  running. Limit your exposure to cool or dry air. Rest Rest as much as possible. Sleep with your head raised (elevated). Make sure you get enough sleep each night. General instructions  Apply a warm, moist washcloth to your face 3-4 times a day or as told by your health care provider. This will help with discomfort. Use nasal saline washes as often as told by your health care provider. Wash your hands often with soap and water to reduce your exposure to germs. If soap and water are not available, use hand sanitizer. Do not smoke. Avoid being around people who are smoking (secondhand smoke). Keep all follow-up visits. This is important. Contact a health care provider if: You have a fever. Your symptoms get worse. Your symptoms do not improve within 10 days. Get help right away if: You have a severe headache. You have persistent  vomiting. You have severe pain or swelling around your face or eyes. You have vision problems. You develop confusion. Your neck is stiff. You have trouble breathing. These symptoms may be an emergency. Get help right away. Call 911. Do not wait to see if the symptoms will go away. Do not drive yourself to the hospital. Summary A sinus infection is soreness and inflammation of your sinuses. Sinuses are hollow spaces in the bones around your face. This condition is caused by nasal tissues that become inflamed or swollen. The swelling traps or blocks the flow of mucus. This allows bacteria, viruses, and fungi to grow, which leads to infection. If you were prescribed an antibiotic medicine, take it as told by your health care provider. Do not stop taking the antibiotic even if you start to feel better. Keep all follow-up visits. This is important. This information is not intended to replace advice given to you by your health care provider. Make sure you discuss any questions you have with your health care provider. Document Revised: 12/10/2020 Document Reviewed: 12/10/2020 Elsevier Patient Education  2024 Elsevier Inc.   If you have been instructed to have an in-person evaluation today at a local Urgent Care facility, please use the link below. It will take you to a list of all of our available [facility], including address, phone number and hours of operation. Please do not delay care.  [facility]  If you or a family member do not have a primary care provider, use the link below to schedule a visit and establish care. When you choose a [facility] primary care physician or advanced practice provider, you gain a long-term partner in health. Find a Primary Care Provider  Learn more about [facility]'s in-office and virtual care options: [facility] - Get Care Now

## 2023-05-24 NOTE — Progress Notes (Signed)
 Virtual Visit Consent   Carol Conner, you are scheduled for a virtual visit with a [facility] provider today. Just as with appointments in the office, your consent must be obtained to participate. Your consent will be active for this visit and any virtual visit you may have with one of our providers in the next 365 days. If you have a MyChart account, a copy of this consent can be sent to you electronically.  As this is a virtual visit, video technology does not allow for your provider to perform a traditional examination. This may limit your provider's ability to fully assess your condition. If your provider identifies any concerns that need to be evaluated in person or the need to arrange testing (such as labs, EKG, etc.), we will make arrangements to do so. Although advances in technology are sophisticated, we cannot ensure that it will always work on either your end or our end. If the connection with a video visit is poor, the visit may have to be switched to a telephone visit. With either a video or telephone visit, we are not always able to ensure that we have a secure connection.  By engaging in this virtual visit, you consent to the provision of healthcare and authorize for your insurance to be billed (if applicable) for the services provided during this visit. Depending on your insurance coverage, you may receive a charge related to this service.  I need to obtain your verbal consent now. Are you willing to proceed with your visit today? Carol Conner has provided verbal consent on 05/24/2023 for a virtual visit (video or telephone). Angelia Kelp, PA-C  Date: 05/24/2023 8:28 AM   Virtual Visit via Video Note   I, Angelia Kelp, connected with  Carol Conner  ([id number], [birth year]) on 05/24/23 at  8:15 AM EDT by a video-enabled telemedicine application and verified that I am speaking with the correct person using two identifiers.  Location: Patient: Virtual Visit Location  Patient: Home Provider: Virtual Visit Location Provider: Home Office   I discussed the limitations of evaluation and management by telemedicine and the availability of in person appointments. The patient expressed understanding and agreed to proceed.    History of Present Illness: Carol Conner is a 63 y.o. who identifies as a female who was assigned female at birth, and is being seen today for Sinus congestion and pain, and cough.  HPI: Sinusitis This is a new problem. The current episode started 1 to 4 weeks ago (started a couple of weeks ago, improved slightly, now worse). The problem has been gradually worsening since onset. There has been no fever. Associated symptoms include chills, congestion, coughing, ear pain (bilateral), headaches, sinus pressure and a sore throat (initial symptom). Pertinent negatives include no diaphoresis, hoarse voice, shortness of breath, sneezing or swollen glands. (Post nasal drainage, wheezing this morning) Past treatments include nothing. The treatment provided no relief.      Problems:  Patient Active Problem List   Diagnosis Date Noted   Dyslipidemia, goal LDL below 100 02/23/2023   Morbid obesity (HCC) 02/23/2023   Pyuria 02/23/2023   Acute cystitis without hematuria 02/23/2023   Stage 2 chronic kidney disease 02/23/2023   Decreased GFR 04/30/2022   Hot flashes 04/30/2022   Diabetes mellitus type 2 with complications (HCC) 04/10/2022   Need for hepatitis B vaccination 04/18/2021   Encounter for general adult medical examination with abnormal findings 04/18/2021   Fatty liver disease, nonalcoholic 04/18/2021  Primary osteoarthritis of left knee 07/11/2019   Knee pain, left 05/05/2018   OBSTRUCTIVE SLEEP APNEA 08/29/2009   Anxiety state 03/11/2007   ANAL STENOSIS 03/11/2007    Allergies:  Allergies  Allergen Reactions   Metformin  And Related     Nausea, constipation   Medications:  Current Outpatient Medications:     amoxicillin-clavulanate (AUGMENTIN) 875-125 MG tablet, Take 1 tablet by mouth 2 (two) times daily., Disp: 20 tablet, Rfl: 0   benzonatate  (TESSALON ) 100 MG capsule, Take 1-2 capsules (100-200 mg total) by mouth 3 (three) times daily as needed., Disp: 30 capsule, Rfl: 0   buPROPion (WELLBUTRIN XL) 150 MG 24 hr tablet, Take 150 mg by mouth every morning., Disp: , Rfl:    Cholecalciferol (VITAMIN D3) 50 MCG (2000 UT) capsule, Take 1 capsule (2,000 Units total) by mouth daily., Disp: 100 capsule, Rfl: 3   Dulaglutide  (TRULICITY ) 3 MG/0.5ML SOAJ, Inject 3 mg as directed once a week., Disp: 2 mL, Rfl: 5   empagliflozin  (JARDIANCE ) 10 MG TABS tablet, Take 1 tablet (10 mg total) by mouth daily before breakfast., Disp: 90 tablet, Rfl: 3   magnesium 30 MG tablet, Take 500 mg by mouth at bedtime. , Disp: , Rfl:    OIL OF OREGANO PO, Take 2 each by mouth 3 times/day as needed-between meals & bedtime., Disp: , Rfl:    Omega-3 Fatty Acids (FISH OIL) 1000 MG CAPS, Take by mouth daily., Disp: , Rfl:    OVER THE COUNTER MEDICATION, Meno Vaginal Support Capsules, Disp: , Rfl:    predniSONE (DELTASONE) 20 MG tablet, Take 1 tablet (20 mg total) by mouth daily with breakfast., Disp: 5 tablet, Rfl: 0  Observations/Objective: Patient is well-developed, well-nourished in no acute distress.  Resting comfortably at home.  Head is normocephalic, atraumatic.  No labored breathing.  Speech is clear and coherent with logical content.  Patient is alert and oriented at baseline.    Assessment and Plan: 1. Acute bacterial sinusitis (Primary) - amoxicillin-clavulanate (AUGMENTIN) 875-125 MG tablet; Take 1 tablet by mouth 2 (two) times daily.  Dispense: 20 tablet; Refill: 0 - predniSONE (DELTASONE) 20 MG tablet; Take 1 tablet (20 mg total) by mouth daily with breakfast.  Dispense: 5 tablet; Refill: 0 - benzonatate  (TESSALON ) 100 MG capsule; Take 1-2 capsules (100-200 mg total) by mouth 3 (three) times daily as needed.   Dispense: 30 capsule; Refill: 0  - Worsening symptoms that have not responded to OTC medications.  - Will give Augmentin, Prednisone, and  - Continue allergy medications.  - Steam and humidifier can help - Stay well hydrated and get plenty of rest.  - Seek in person evaluation if no symptom improvement or if symptoms worsen   Follow Up Instructions: I discussed the assessment and treatment plan with the patient. The patient was provided an opportunity to ask questions and all were answered. The patient agreed with the plan and demonstrated an understanding of the instructions.  A copy of instructions were sent to the patient via MyChart unless otherwise noted below.    The patient was advised to call back or seek an in-person evaluation if the symptoms worsen or if the condition fails to improve as anticipated.    Angelia Kelp, PA-C

## 2023-08-16 ENCOUNTER — Other Ambulatory Visit (HOSPITAL_COMMUNITY): Payer: Self-pay

## 2023-08-16 ENCOUNTER — Telehealth: Payer: Self-pay | Admitting: Pharmacy Technician

## 2023-08-16 NOTE — Telephone Encounter (Addendum)
 Pharmacy Patient Advocate Encounter   Received notification from CoverMyMeds that prior authorization for Trulicity  3MG /0.5ML auto-injectors is due for renewal.   Insurance verification completed.   The patient is insured through [employer]. Archived Key: [id number]  Action: Medication is now available without a prior authorization.

## 2023-09-01 ENCOUNTER — Other Ambulatory Visit (HOSPITAL_COMMUNITY): Payer: Self-pay

## 2023-09-09 NOTE — Progress Notes (Unsigned)
 09/09/2023 Carol Conner [id number] 02/25/1960  Referring provider: Garald Karlynn GAILS, MD Primary GI doctor: Dr. 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  Patients above 3.25% need referral for evaluation.  Patients between 1.45 and 3.25 can get fibroscan/evaluate.  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 Crestor and simvastatin will be reduced to 20 mg, atorvastatin and pravastatin will be reduced to 40 mg.   Gallbladder polyp 06/24/2022 RUQ US  similar appearing 9 to 10 mm polyp gallbladder lumen repeat 6 months Has had 2 ultrasounds within a year with stable gallbladder polyp Will get CT abdomen pelvis with contrast, consider [facility] referral versus continued surveillance yearly Patient with gallbladder polyp between 6 to 9 mm, we can refer for evaluation by [facility] for cholecystectomy, if patient wishes not to undergo cholecystectomy suggest surveillance ultrasounds every 6 months for a year and then every year after that.    Morbid obesity (HCC) There is no height or weight on file to calculate  BMI.  -Patient has been advised to make an attempt to improve diet and exercise patterns to aid in weight loss. -Recommended diet heavy in fruits and veggies and low in animal meats, cheeses, and dairy products, appropriate calorie intake  Screen for colon cancer 03/20/2014 colonoscopy with Dr. Aneita, excellent prep, normal colonoscopy recall 10 years recall 03/2024.  Type 2 diabetes with CKD stage II On Trulicity  and Jardiance   Patient Care Team: Plotnikov, Karlynn GAILS, MD as PCP - Diedre Livingston Rigg, MD as Consulting Physician (Dermatology)  HISTORY OF PRESENT ILLNESS: 63 y.o. female with a past medical history of OSA, fatty liver, type 2 diabetes, morbid obesity, and others listed below presents for evaluation of hemorrhoids and gallbladder  Patient last seen in the office 05/20/2022 by myself for elevated LFTs Found to have gallbladder polyp had 2 abdominal ultrasounds with stable polyp.  Presents for follow-up  Wt Readings from Last 5 Encounters:  05/19/23 238 lb 6.4 oz (108.1 kg)  03/25/23 242 lb (109.8 kg)  02/23/23 240 lb 12.8 oz (109.2 kg)  08/17/22 246 lb (111.6 kg)  05/20/22 252 lb (114.3 kg)     She  reports that she has never smoked. She has been exposed to tobacco smoke. She has never used smokeless tobacco. She reports that she does not drink alcohol and does not use drugs.  RELEVANT LABS AND IMAGING: CBC    Component Value Date/Time   WBC 3.8 (L) 02/23/2023 1134   RBC 4.72 02/23/2023 1134   HGB 14.5 02/23/2023 1134   HCT 43.9 02/23/2023 1134   PLT 240.0 02/23/2023 1134   MCV 93.1 02/23/2023 1134   MCH 29.8 07/24/2019 1320   MCHC 33.0 02/23/2023 1134   RDW 14.1 02/23/2023 1134   LYMPHSABS 1.8 02/23/2023 1134   MONOABS 0.3 02/23/2023 1134   EOSABS 0.1 02/23/2023 1134   BASOSABS 0.0 02/23/2023 1134   Recent Labs    02/23/23 1134  HGB 14.5    CMP     Component Value Date/Time   NA 136 02/23/2023 1134   K 4.2 02/23/2023 1134   CL 101 02/23/2023  1134   CO2 27 02/23/2023 1134   GLUCOSE 79 02/23/2023 1134   BUN 10 02/23/2023 1134   CREATININE 1.14 02/23/2023 1134   CALCIUM 9.5 02/23/2023 1134   PROT 7.9 02/23/2023 1134   ALBUMIN 4.2 02/23/2023 1134   AST 61 (H) 02/23/2023 1134   ALT 69 (H) 02/23/2023 1134   ALKPHOS 78 02/23/2023 1134   BILITOT 0.9 02/23/2023 1134   GFRNONAA >60 07/24/2019 1320   GFRAA >60 07/24/2019 1320      Latest Ref Rng & Units 02/23/2023   11:34 AM 04/09/2022   11:30 AM 07/17/2021   11:35 AM  Hepatic Function  Total Protein 6.0 - 8.3 g/dL 7.9  7.4  7.9   Albumin 3.5 - 5.2 g/dL 4.2  3.9  4.3   AST 0 - 37 U/L 61  68  65   ALT 0 - 35 U/L 69  83  81   Alk Phosphatase 39 - 117 U/L 78  82  82   Total Bilirubin 0.2 - 1.2 mg/dL 0.9  0.6  0.9   Bilirubin, Direct 0.0 - 0.3 mg/dL 0.1         Current Medications:   Current Outpatient Medications (Endocrine & Metabolic):    Dulaglutide  (TRULICITY ) 3 MG/0.5ML SOAJ, Inject 3 mg as directed once a week.   empagliflozin  (JARDIANCE ) 10 MG TABS tablet, Take 1 tablet (10 mg total) by mouth daily before breakfast.   predniSONE  (DELTASONE ) 20 MG tablet, Take 1 tablet (20 mg total) by mouth daily with breakfast.   Current Outpatient Medications (Respiratory):    benzonatate  (TESSALON ) 100 MG capsule, Take 1-2 capsules (100-200 mg total) by mouth 3 (three) times daily as needed.    Current Outpatient Medications (Other):    amoxicillin -clavulanate (AUGMENTIN ) 875-125 MG tablet, Take 1 tablet by mouth 2 (two) times daily.   buPROPion (WELLBUTRIN XL) 150 MG 24 hr tablet, Take 150 mg by mouth every morning.   Cholecalciferol (VITAMIN D3) 50 MCG (2000 UT) capsule, Take 1 capsule (2,000 Units total) by mouth daily.   magnesium 30 MG tablet, Take 500 mg by mouth at bedtime.    OIL OF OREGANO PO, Take 2 each by mouth 3 times/day as needed-between meals & bedtime.   Omega-3 Fatty Acids (FISH OIL) 1000 MG CAPS, Take by mouth daily.   OVER THE COUNTER MEDICATION, Meno  Vaginal Support Capsules  Medical History:  Past Medical History:  Diagnosis Date   Anxiety    Genital herpes    Heme positive stool    Sleep apnea    uses c-pap   Uterine leiomyoma    Allergies:  Allergies  Allergen Reactions   Metformin  And Related     Nausea, constipation  Surgical History:  She  has a past surgical history that includes Abdominal hysterectomy; Appendectomy; abd infection; Bilateral oophorectomy; Breast reduction surgery; and Colonoscopy. Family History:  Her family history includes Breast cancer in her mother; Cancer in her father; Kidney disease in her mother; Liver cancer in her father; Prostate cancer in her father; Pulmonary embolism in her sister.  REVIEW OF SYSTEMS  : All other systems reviewed and negative except where noted in the History of Present Illness.  PHYSICAL EXAM: There were no vitals taken for this visit. General Appearance: Well nourished, in no apparent distress. Head:   Normocephalic and atraumatic. Eyes:  sclerae anicteric,conjunctive pink  Respiratory: Respiratory effort normal, BS equal bilaterally without rales, rhonchi, wheezing. Cardio: RRR with no MRGs. Peripheral pulses intact.  Abdomen: Soft,  Obese ,active bowel sounds. No tenderness . Without guarding and Without rebound. No masses. Rectal: Not evaluated Musculoskeletal: Full ROM, Normal gait. Without edema. Skin:  Dry and intact without significant lesions or rashes Neuro: Alert and  oriented x4;  No focal deficits. Psych:  Cooperative. Normal mood and affect.    Alan JONELLE Coombs, PA-C 8:17 AM

## 2023-09-10 ENCOUNTER — Ambulatory Visit: Admitting: Physician Assistant

## 2023-09-10 ENCOUNTER — Encounter: Payer: Self-pay | Admitting: Physician Assistant

## 2023-09-10 VITALS — BP 122/80 | HR 87 | Ht 63.5 in | Wt 232.0 lb

## 2023-09-10 DIAGNOSIS — D509 Iron deficiency anemia, unspecified: Secondary | ICD-10-CM | POA: Diagnosis not present

## 2023-09-10 DIAGNOSIS — K824 Cholesterolosis of gallbladder: Secondary | ICD-10-CM | POA: Diagnosis not present

## 2023-09-10 DIAGNOSIS — K625 Hemorrhage of anus and rectum: Secondary | ICD-10-CM

## 2023-09-10 DIAGNOSIS — K760 Fatty (change of) liver, not elsewhere classified: Secondary | ICD-10-CM

## 2023-09-10 NOTE — Patient Instructions (Addendum)
 Your provider has requested that you go to the basement level for lab work before leaving today. Press B on the elevator. The lab is located at the first door on the left as you exit the elevator.  Anal Fissure, Adult  A fissure is a linear defect in the anal mucosa, symptoms include burning, itching, discomfort especially with a bowel movement with associated rectal bleeding.  Risk factors include low fiber diet, chronic constipation and straining. Anal fissures can take a very long time to heal so this will be a 2 to 11-month process.  Treatment for a fissure includes:  -decreasing time in the toilet should not be more than 5 minutes -adding fiber supplement such as Benefiber or Citrucel -increasing water. -There is a lubricating suppository over-the-counter called Calmol-4 you can get from [facility] or from your pharmacy (may have to order) that I want to do twice daily morning and evening for 8 weeks.  This is a 60 to 80% success rate. - if not better will send in a compound cream   Toileting tips to help with your constipation - Drink at least 64-80 ounces of water/liquid per day. - Establish a time to try to move your bowels every day.  For many people, this is after a cup of coffee or after a meal such as breakfast. - Sit all of the way back on the toilet keeping your back fairly straight and while sitting up, try to rest the tops of your forearms on your upper thighs.   - Raising your feet with a step stool/squatty potty can be helpful to improve the angle that allows your stool to pass through the rectum. - Relax the rectum feeling it bulge toward the toilet water.  If you feel your rectum raising toward your body, you are contracting rather than relaxing. - Breathe in and slowly exhale. Belly breath by expanding your belly towards your belly button. Keep belly expanded as you gently direct pressure down and back to the anus.  A low pitched GRRR sound can assist with increasing  intra-abdominal pressure.  (Can also trying to blow on a pinwheel and make it move, this helps with the same belly breathing) - Repeat 3-4 times. If unsuccessful, contract the pelvic floor to restore normal tone and get off the toilet.  Avoid excessive straining. - To reduce excessive wiping by teaching your anus to normally contract, place hands on outer aspect of knees and resist knee movement outward.  Hold 5-10 second then place hands just inside of knees and resist inward movement of knees.  Hold 5 seconds.  Repeat a few times each way.  Go to the ER if unable to pass gas, severe AB pain, unable to hold down food, any shortness of breath of chest pain.  Miralax is an osmotic laxative.  It only brings more water into the stool.  This is safe to take daily.  Can take up to 17 gram of miralax twice a day.  Mix with juice or coffee.  Start 1 capful at night for 3-4 days and reassess your response in 3-4 days.  You can increase and decrease the dose based on your response.  Remember, it can take up to 3-4 days to take effect OR for the effects to wear off.   I often pair this with benefiber in the morning to help assure the stool is not too loose.   FIBER SUPPLEMENT You can do metamucil or fibercon once or twice a day but if  this causes gas/bloating please switch to Benefiber or Citracel.  Fiber is good for constipation/diarrhea/irritable bowel syndrome.  It can also help with weight loss and can help lower your bad cholesterol (LDL).  Please do 1 TBSP in the morning in water, coffee, or tea.  It can take up to a month before you can see a difference with your bowel movements.  It is cheapest from [employer], [employer], [employer].   You have been scheduled for a CT scan of the abdomen and pelvis at [facility], 1st floor Radiology. You are scheduled on 09/16/2023 at 3:30pm. You should arrive 30 minutes prior to your appointment time for registration.    You may take any medications as  prescribed with a small amount of water, if necessary. If you take any of the following medications: METFORMIN , GLUCOPHAGE , GLUCOVANCE, AVANDAMET, RIOMET , FORTAMET , ACTOPLUS MET, JANUMET, GLUMETZA  or METAGLIP, you MAY be asked to HOLD this medication 48 hours AFTER the exam.   If you have any questions regarding your exam or if you need to reschedule, you may call [facility] at [phone] between the hours of 8:00 am and 5:00 pm, Monday-Friday.   Call us  back early to mid September to set up an October/November follow up appointment.   I appreciate the opportunity to care for you. Alan Coombs, PA

## 2023-09-13 ENCOUNTER — Other Ambulatory Visit (INDEPENDENT_AMBULATORY_CARE_PROVIDER_SITE_OTHER)

## 2023-09-13 DIAGNOSIS — N182 Chronic kidney disease, stage 2 (mild): Secondary | ICD-10-CM

## 2023-09-13 DIAGNOSIS — D509 Iron deficiency anemia, unspecified: Secondary | ICD-10-CM

## 2023-09-13 DIAGNOSIS — K625 Hemorrhage of anus and rectum: Secondary | ICD-10-CM

## 2023-09-13 DIAGNOSIS — K760 Fatty (change of) liver, not elsewhere classified: Secondary | ICD-10-CM

## 2023-09-13 DIAGNOSIS — E118 Type 2 diabetes mellitus with unspecified complications: Secondary | ICD-10-CM | POA: Diagnosis not present

## 2023-09-13 DIAGNOSIS — K824 Cholesterolosis of gallbladder: Secondary | ICD-10-CM

## 2023-09-13 LAB — COMPREHENSIVE METABOLIC PANEL WITH GFR
ALT: 57 U/L — ABNORMAL HIGH (ref 0–35)
AST: 51 U/L — ABNORMAL HIGH (ref 0–37)
Albumin: 4.2 g/dL (ref 3.5–5.2)
Alkaline Phosphatase: 76 U/L (ref 39–117)
BUN: 13 mg/dL (ref 6–23)
CO2: 27 meq/L (ref 19–32)
Calcium: 9.5 mg/dL (ref 8.4–10.5)
Chloride: 101 meq/L (ref 96–112)
Creatinine, Ser: 1.13 mg/dL (ref 0.40–1.20)
GFR: 52.71 mL/min — ABNORMAL LOW (ref >60.00)
Glucose, Bld: 83 mg/dL (ref 70–99)
Potassium: 4.4 meq/L (ref 3.5–5.1)
Sodium: 138 meq/L (ref 135–145)
Total Bilirubin: 0.5 mg/dL (ref 0.2–1.2)
Total Protein: 8 g/dL (ref 6.0–8.3)

## 2023-09-13 LAB — IBC + FERRITIN
Ferritin: 117.5 ng/mL (ref 10.0–291.0)
Iron: 74 ug/dL (ref 42–145)
Saturation Ratios: 21.7 % (ref 20.0–50.0)
TIBC: 341.6 ug/dL (ref 250.0–450.0)
Transferrin: 244 mg/dL (ref 212.0–360.0)

## 2023-09-13 LAB — CBC WITH DIFFERENTIAL/PLATELET
Basophils Absolute: 0 K/uL (ref 0.0–0.1)
Basophils Relative: 0.7 % (ref 0.0–3.0)
Eosinophils Absolute: 0.1 K/uL (ref 0.0–0.7)
Eosinophils Relative: 2.8 % (ref 0.0–5.0)
HCT: 45.9 % (ref 36.0–46.0)
Hemoglobin: 15.2 g/dL — ABNORMAL HIGH (ref 12.0–15.0)
Lymphocytes Relative: 50.4 % — ABNORMAL HIGH (ref 12.0–46.0)
Lymphs Abs: 2.4 K/uL (ref 0.7–4.0)
MCHC: 33.1 g/dL (ref 30.0–36.0)
MCV: 91.7 fl (ref 78.0–100.0)
Monocytes Absolute: 0.2 K/uL (ref 0.1–1.0)
Monocytes Relative: 5 % (ref 3.0–12.0)
Neutro Abs: 2 K/uL (ref 1.4–7.7)
Neutrophils Relative %: 41.1 % — ABNORMAL LOW (ref 43.0–77.0)
Platelets: 245 K/uL (ref 150.0–400.0)
RBC: 5.01 Mil/uL (ref 3.87–5.11)
RDW: 14.5 % (ref 11.5–15.5)
WBC: 4.8 K/uL (ref 4.0–10.5)

## 2023-09-13 LAB — HEMOGLOBIN A1C: Hgb A1c MFr Bld: 6 % (ref 4.6–6.5)

## 2023-09-14 ENCOUNTER — Ambulatory Visit: Payer: Self-pay | Admitting: Physician Assistant

## 2023-09-14 DIAGNOSIS — K760 Fatty (change of) liver, not elsewhere classified: Secondary | ICD-10-CM

## 2023-09-15 ENCOUNTER — Ambulatory Visit: Payer: Self-pay | Admitting: Internal Medicine

## 2023-09-16 ENCOUNTER — Ambulatory Visit (HOSPITAL_COMMUNITY)

## 2023-09-16 ENCOUNTER — Encounter (HOSPITAL_COMMUNITY): Payer: Self-pay

## 2023-09-17 NOTE — Telephone Encounter (Signed)
 Inbound call from [employer] with approval for CT scan. States authorization number is [id number] and valid from 09/10/23-03/09/23. States if there are any questions call back number is [phone] ext [phone]. Please advise, thank you

## 2023-09-22 ENCOUNTER — Ambulatory Visit (HOSPITAL_COMMUNITY)
Admission: RE | Admit: 2023-09-22 | Discharge: 2023-09-22 | Disposition: A | Discharge status: Home / Self Care | Source: Ambulatory Visit | Attending: Physician Assistant | Admitting: Physician Assistant

## 2023-09-22 DIAGNOSIS — K760 Fatty (change of) liver, not elsewhere classified: Secondary | ICD-10-CM | POA: Insufficient documentation

## 2023-10-03 ENCOUNTER — Ambulatory Visit: Payer: Self-pay | Admitting: Gastroenterology

## 2023-10-03 NOTE — Progress Notes (Deleted)
   Acute Office Visit  Subjective:     Patient ID: Carol Conner, female    DOB: [birth year], 62 y.o.   MRN: [id number]  No chief complaint on file.   HPI  Discussed the use of AI scribe software for clinical note transcription with the patient, who gave verbal consent to proceed.  History of Present Illness      ROS Per HPI      Objective:    There were no vitals taken for this visit.   Physical Exam Vitals and nursing note reviewed.  Constitutional:      General: She is not in acute distress.    Appearance: Normal appearance. She is normal weight.  HENT:     Head: Normocephalic and atraumatic.     Right Ear: External ear normal.     Left Ear: External ear normal.     Nose: Nose normal.     Mouth/Throat:     Mouth: Mucous membranes are moist.     Pharynx: Oropharynx is clear.  Eyes:     Extraocular Movements: Extraocular movements intact.     Pupils: Pupils are equal, round, and reactive to light.  Cardiovascular:     Rate and Rhythm: Normal rate and regular rhythm.     Pulses: Normal pulses.     Heart sounds: Normal heart sounds.  Pulmonary:     Effort: Pulmonary effort is normal. No respiratory distress.     Breath sounds: Normal breath sounds. No wheezing, rhonchi or rales.  Musculoskeletal:        General: Normal range of motion.     Cervical back: Normal range of motion.     Right lower leg: No edema.     Left lower leg: No edema.  Lymphadenopathy:     Cervical: No cervical adenopathy.  Neurological:     General: No focal deficit present.     Mental Status: She is alert and oriented to person, place, and time.  Psychiatric:        Mood and Affect: Mood normal.        Thought Content: Thought content normal.     No results found for any visits on 10/05/23.      Assessment & Plan:   Assessment and Plan Assessment & Plan      No orders of the defined types were placed in this encounter.    No orders of the defined types were placed  in this encounter.   No follow-ups on file.  Corean LITTIE Ku, FNP

## 2023-10-05 ENCOUNTER — Ambulatory Visit: Admitting: Family Medicine

## 2023-10-05 ENCOUNTER — Encounter: Payer: Self-pay | Admitting: Family Medicine

## 2023-10-05 VITALS — BP 110/70 | HR 73 | Temp 98.1°F | Ht 63.5 in | Wt 229.6 lb

## 2023-10-05 DIAGNOSIS — L02212 Cutaneous abscess of back [any part, except buttock]: Secondary | ICD-10-CM

## 2023-10-05 DIAGNOSIS — H00014 Hordeolum externum left upper eyelid: Secondary | ICD-10-CM

## 2023-10-05 MED ORDER — ERYTHROMYCIN 5 MG/GM OP OINT: 1.0000 | TOPICAL_OINTMENT | Freq: Two times a day (BID) | OPHTHALMIC | 0 refills | Status: AC

## 2023-10-05 NOTE — Patient Instructions (Signed)
 I have sent in erythromycin  ointment for you to use one ribbon twice a day for 5 days. This should help clear up your eye.   We have drained the abscess on your back today.  May use warm moist compresses to help facilitate drainage.  May cleanse the area with warm soapy water.  Follow-up with us  for any increased redness, tenderness, fever, chills, abdominal pain, nausea, vomiting, diarrhea, rash, other concerns.

## 2023-10-05 NOTE — Progress Notes (Signed)
 Acute Office Visit  Subjective:     Patient ID: Carol Conner, female    DOB: June 28, 1960, 63 y.o.   MRN: [id number]  Chief Complaint  Patient presents with   Acute Visit    HPI  Discussed the use of AI scribe software for clinical note transcription with the patient, who gave verbal consent to proceed.  History of Present Illness Carol Conner is a 63 year old female who presents with a persistent stye and a draining spot on her back.  Eyelid lesion (stye) - Persistent lesion on the eyelid for approximately two weeks - Initially sore, now larger and concentrated in one spot - Currently without pain or drainage - Warm compresses with lavender oil applied  Subcutaneous back lesion - Non-painful lump on the back - Previously draining, now no drainage - Lesion is growing in size - No redness or pain  Medication allergies - No known antibiotic allergies     ROS Per HPI      Objective:    BP 110/70 (BP Location: Left Arm, Patient Position: Sitting)   Pulse 73   Temp 98.1 F (36.7 C) (Temporal)   Ht 5' 3.5 (1.613 m)   Wt 229 lb 9.6 oz (104.1 kg)   SpO2 97%   BMI 40.03 kg/m    Physical Exam Vitals and nursing note reviewed.  Constitutional:      General: She is not in acute distress.    Appearance: Normal appearance. She is normal weight.  HENT:     Head: Normocephalic and atraumatic.     Right Ear: External ear normal.     Left Ear: External ear normal.     Nose: Nose normal.     Mouth/Throat:     Mouth: Mucous membranes are moist.     Pharynx: Oropharynx is clear.  Eyes:     General:        Left eye: Hordeolum present.    Extraocular Movements: Extraocular movements intact.     Pupils: Pupils are equal, round, and reactive to light.      Comments: Area of erythema, mild tenderness, swelling. No open wound, no bleeding, no discharge noted  Cardiovascular:     Rate and Rhythm: Normal rate and regular rhythm.     Pulses: Normal pulses.      Heart sounds: Normal heart sounds.  Pulmonary:     Effort: Pulmonary effort is normal. No respiratory distress.     Breath sounds: Normal breath sounds. No wheezing, rhonchi or rales.  Musculoskeletal:        General: Normal range of motion.     Cervical back: Normal range of motion.     Right lower leg: No edema.     Left lower leg: No edema.  Lymphadenopathy:     Cervical: No cervical adenopathy.  Skin:    Findings: Abscess present.         Comments: 1 cm area of erythema, mild tenderness, with area of induration consistent with cutaneous abscess. No bleeding, no discharge  Neurological:     General: No focal deficit present.     Mental Status: She is alert and oriented to person, place, and time.  Psychiatric:        Mood and Affect: Mood normal.        Thought Content: Thought content normal.   I & D  Date/Time: 10/05/2023 11:04 AM  Performed by: Alvia Corean CROME, FNP Authorized by: Alvia Corean CROME, FNP   Consent:  Consent obtained:  Verbal   Consent given by:  Patient   Risks, benefits, and alternatives were discussed: yes     Risks discussed:  Bleeding and incomplete drainage   Alternatives discussed:  Alternative treatment and delayed treatment Location:    Type:  Abscess   Size:  1 cm   Location:  Trunk   Trunk location:  Back Pre-procedure details:    Skin preparation:  Alcohol and povidone-iodine Sedation:    Sedation type:  None Anesthesia:    Anesthesia method:  Local infiltration   Local anesthetic:  Lidocaine  2% w/o epi Procedure type:    Complexity:  Simple Procedure details:    Needle aspiration: no     Incision types:  Stab incision   Incision depth:  Dermal   Scalpel blade:  11   Wound management:  Probed and deloculated   Drainage:  Bloody and purulent   Drainage amount:  Moderate   Wound treatment:  Wound left open   Packing materials:  None Post-procedure details:    Procedure completion:  Tolerated well, no immediate  complications    No results found for any visits on 10/05/23.      Assessment & Plan:   Assessment and Plan Assessment & Plan Stye (hordeolum) of eyelid Stye enlarging without pain or drainage. No antibiotic allergies. - Prescribed erythromycin  ophthalmic ointment, apply twice daily for 3-5 days. - Advised to avoid ointment application when driving or needing clear vision. - Sent prescription to [facility].  Cutaneous Abscess Lesion inflamed, not infected. Previously draining, now growing. - I&D in office today - specimen sent to lab for culture - Will f/u with results once received     Orders Placed This Encounter  Procedures   I & D    This order was created via procedure documentation   WOUND CULTURE    Source:   cutaneous abscess     Meds ordered this encounter  Medications   erythromycin  ophthalmic ointment    Sig: Place 1 Application into the left eye 2 (two) times daily.    Dispense:  3.5 g    Refill:  0    Return if symptoms worsen or fail to improve.  Corean LITTIE Ku, FNP

## 2023-10-09 LAB — WOUND CULTURE

## 2023-10-10 ENCOUNTER — Ambulatory Visit: Payer: Self-pay | Admitting: Family Medicine

## 2023-10-10 NOTE — Progress Notes (Signed)
 Wound culture showed white cells within the skin lesion. No infection noted.

## 2023-11-01 ENCOUNTER — Ambulatory Visit: Admitting: Family Medicine

## 2023-11-01 NOTE — Progress Notes (Deleted)
   Acute Office Visit  Subjective:     Patient ID: Carol Conner, female    DOB: Nov 24, 1960, 63 y.o.   MRN: [id number]  No chief complaint on file.   HPI  Discussed the use of AI scribe software for clinical note transcription with the patient, who gave verbal consent to proceed.  History of Present Illness      ROS Per HPI      Objective:    There were no vitals taken for this visit.   Physical Exam Vitals and nursing note reviewed.  Constitutional:      General: She is not in acute distress.    Appearance: Normal appearance. She is normal weight.  HENT:     Head: Normocephalic and atraumatic.     Right Ear: External ear normal.     Left Ear: External ear normal.     Nose: Nose normal.     Mouth/Throat:     Mouth: Mucous membranes are moist.     Pharynx: Oropharynx is clear.  Eyes:     Extraocular Movements: Extraocular movements intact.     Pupils: Pupils are equal, round, and reactive to light.  Cardiovascular:     Rate and Rhythm: Normal rate and regular rhythm.     Pulses: Normal pulses.     Heart sounds: Normal heart sounds.  Pulmonary:     Effort: Pulmonary effort is normal. No respiratory distress.     Breath sounds: Normal breath sounds. No wheezing, rhonchi or rales.  Musculoskeletal:        General: Normal range of motion.     Cervical back: Normal range of motion.     Right lower leg: No edema.     Left lower leg: No edema.  Lymphadenopathy:     Cervical: No cervical adenopathy.  Neurological:     General: No focal deficit present.     Mental Status: She is alert and oriented to person, place, and time.  Psychiatric:        Mood and Affect: Mood normal.        Thought Content: Thought content normal.     No results found for any visits on 11/01/23.      Assessment & Plan:   Assessment and Plan Assessment & Plan      No orders of the defined types were placed in this encounter.    No orders of the defined types were placed  in this encounter.   No follow-ups on file.  Corean LITTIE Ku, FNP

## 2023-11-15 ENCOUNTER — Ambulatory Visit: Admitting: Family Medicine

## 2023-11-15 VITALS — BP 128/70 | HR 72 | Temp 97.8°F | Ht 63.5 in | Wt 226.2 lb

## 2023-11-15 DIAGNOSIS — H00014 Hordeolum externum left upper eyelid: Secondary | ICD-10-CM | POA: Diagnosis not present

## 2023-11-15 NOTE — Progress Notes (Unsigned)
   Acute Office Visit  Subjective:     Patient ID: Carol Conner, female    DOB: Sep 08, 1960, 63 y.o.   MRN: [id number]  No chief complaint on file.   HPI  Discussed the use of AI scribe software for clinical note transcription with the patient, who gave verbal consent to proceed.  History of Present Illness      ROS Per HPI      Objective:    There were no vitals taken for this visit.   Physical Exam Vitals and nursing note reviewed.  Constitutional:      General: She is not in acute distress.    Appearance: Normal appearance. She is normal weight.  HENT:     Head: Normocephalic and atraumatic.     Right Ear: External ear normal.     Left Ear: External ear normal.     Nose: Nose normal.     Mouth/Throat:     Mouth: Mucous membranes are moist.     Pharynx: Oropharynx is clear.  Eyes:     Extraocular Movements: Extraocular movements intact.     Pupils: Pupils are equal, round, and reactive to light.  Cardiovascular:     Rate and Rhythm: Normal rate and regular rhythm.     Pulses: Normal pulses.     Heart sounds: Normal heart sounds.  Pulmonary:     Effort: Pulmonary effort is normal. No respiratory distress.     Breath sounds: Normal breath sounds. No wheezing, rhonchi or rales.  Musculoskeletal:        General: Normal range of motion.     Cervical back: Normal range of motion.     Right lower leg: No edema.     Left lower leg: No edema.  Lymphadenopathy:     Cervical: No cervical adenopathy.  Neurological:     General: No focal deficit present.     Mental Status: She is alert and oriented to person, place, and time.  Psychiatric:        Mood and Affect: Mood normal.        Thought Content: Thought content normal.     No results found for any visits on 11/15/23.      Assessment & Plan:   Assessment and Plan Assessment & Plan      No orders of the defined types were placed in this encounter.    No orders of the defined types were placed  in this encounter.   No follow-ups on file.  Corean LITTIE Ku, FNP

## 2023-11-15 NOTE — Patient Instructions (Addendum)
 [facility] [address] [place], [place] [address]  Recommend calling to schedule appt for removal.  Follow-up with me for new or worsening symptoms.

## 2023-11-29 ENCOUNTER — Ambulatory Visit (HOSPITAL_COMMUNITY)
Admission: RE | Admit: 2023-11-29 | Discharge: 2023-11-29 | Disposition: A | Discharge status: Home / Self Care | Source: Ambulatory Visit | Attending: Physician Assistant | Admitting: Physician Assistant

## 2023-11-29 DIAGNOSIS — K824 Cholesterolosis of gallbladder: Secondary | ICD-10-CM | POA: Diagnosis present

## 2023-11-29 DIAGNOSIS — E118 Type 2 diabetes mellitus with unspecified complications: Secondary | ICD-10-CM | POA: Diagnosis present

## 2023-11-29 DIAGNOSIS — K760 Fatty (change of) liver, not elsewhere classified: Secondary | ICD-10-CM | POA: Insufficient documentation

## 2023-11-29 LAB — POCT I-STAT CREATININE: Creatinine, Ser: 1.2 mg/dL — ABNORMAL HIGH (ref 0.44–1.00)

## 2023-11-29 MED ORDER — SODIUM CHLORIDE (PF) 0.9 % IJ SOLN
INTRAMUSCULAR | Status: AC
  Filled 2023-11-29: qty 50

## 2023-11-29 MED ORDER — IOHEXOL 300 MG/ML  SOLN
100.0000 mL | Freq: Once | INTRAMUSCULAR | Status: AC | PRN
  Administered 2023-11-29: 100 mL via INTRAVENOUS
